# Patient Record
Sex: Female | Born: 1965 | Race: White | Hispanic: No | Smoking: Former smoker
Health system: Southern US, Community
[De-identification: ages and names within clinical notes are randomized; demographics above are authoritative.]

## PROBLEM LIST (undated history)

## (undated) DIAGNOSIS — N200 Calculus of kidney: Secondary | ICD-10-CM

## (undated) DIAGNOSIS — F329 Major depressive disorder, single episode, unspecified: Secondary | ICD-10-CM

## (undated) DIAGNOSIS — T4145XA Adverse effect of unspecified anesthetic, initial encounter: Secondary | ICD-10-CM

## (undated) DIAGNOSIS — S42009A Fracture of unspecified part of unspecified clavicle, initial encounter for closed fracture: Secondary | ICD-10-CM

## (undated) DIAGNOSIS — Z9889 Other specified postprocedural states: Secondary | ICD-10-CM

## (undated) DIAGNOSIS — K219 Gastro-esophageal reflux disease without esophagitis: Secondary | ICD-10-CM

## (undated) DIAGNOSIS — F32A Depression, unspecified: Secondary | ICD-10-CM

## (undated) DIAGNOSIS — F419 Anxiety disorder, unspecified: Secondary | ICD-10-CM

## (undated) DIAGNOSIS — T8859XA Other complications of anesthesia, initial encounter: Secondary | ICD-10-CM

## (undated) DIAGNOSIS — R112 Nausea with vomiting, unspecified: Secondary | ICD-10-CM

## (undated) DIAGNOSIS — Z87442 Personal history of urinary calculi: Secondary | ICD-10-CM

## (undated) HISTORY — DX: Major depressive disorder, single episode, unspecified: F32.9

## (undated) HISTORY — PX: ABDOMINAL HYSTERECTOMY: SHX81

## (undated) HISTORY — PX: LEG SURGERY: SHX1003

## (undated) HISTORY — DX: Calculus of kidney: N20.0

## (undated) HISTORY — DX: Depression, unspecified: F32.A

---

## 1992-09-05 HISTORY — PX: BREAST CYST EXCISION: SHX579

## 1998-07-07 ENCOUNTER — Other Ambulatory Visit: Admission: RE | Admit: 1998-07-07 | Discharge: 1998-07-07 | Payer: Self-pay | Admitting: *Deleted

## 1999-05-12 ENCOUNTER — Encounter: Admission: RE | Admit: 1999-05-12 | Discharge: 1999-08-10 | Payer: Self-pay | Admitting: Family Medicine

## 1999-05-31 ENCOUNTER — Other Ambulatory Visit: Admission: RE | Admit: 1999-05-31 | Discharge: 1999-05-31 | Payer: Self-pay | Admitting: *Deleted

## 2000-05-30 ENCOUNTER — Other Ambulatory Visit: Admission: RE | Admit: 2000-05-30 | Discharge: 2000-05-30 | Payer: Self-pay | Admitting: *Deleted

## 2000-11-08 ENCOUNTER — Encounter (INDEPENDENT_AMBULATORY_CARE_PROVIDER_SITE_OTHER): Payer: Self-pay | Admitting: Specialist

## 2000-11-08 ENCOUNTER — Ambulatory Visit (HOSPITAL_COMMUNITY): Admission: RE | Admit: 2000-11-08 | Discharge: 2000-11-08 | Payer: Self-pay | Admitting: Surgery

## 2001-04-30 ENCOUNTER — Other Ambulatory Visit: Admission: RE | Admit: 2001-04-30 | Discharge: 2001-04-30 | Payer: Self-pay | Admitting: *Deleted

## 2002-07-22 ENCOUNTER — Other Ambulatory Visit: Admission: RE | Admit: 2002-07-22 | Discharge: 2002-07-22 | Payer: Self-pay | Admitting: *Deleted

## 2004-11-25 ENCOUNTER — Other Ambulatory Visit: Admission: RE | Admit: 2004-11-25 | Discharge: 2004-11-25 | Payer: Self-pay | Admitting: Obstetrics and Gynecology

## 2004-12-31 ENCOUNTER — Emergency Department (HOSPITAL_COMMUNITY): Admission: EM | Admit: 2004-12-31 | Discharge: 2004-12-31 | Payer: Self-pay | Admitting: Emergency Medicine

## 2005-09-05 HISTORY — PX: CLAVICLE SURGERY: SHX598

## 2005-11-12 ENCOUNTER — Emergency Department (HOSPITAL_COMMUNITY): Admission: EM | Admit: 2005-11-12 | Discharge: 2005-11-12 | Payer: Self-pay | Admitting: Emergency Medicine

## 2005-11-30 ENCOUNTER — Other Ambulatory Visit: Admission: RE | Admit: 2005-11-30 | Discharge: 2005-11-30 | Payer: Self-pay | Admitting: Obstetrics and Gynecology

## 2006-04-17 ENCOUNTER — Inpatient Hospital Stay (HOSPITAL_COMMUNITY): Admission: AD | Admit: 2006-04-17 | Discharge: 2006-04-17 | Payer: Self-pay | Admitting: Obstetrics and Gynecology

## 2006-04-21 ENCOUNTER — Ambulatory Visit (HOSPITAL_COMMUNITY): Admission: RE | Admit: 2006-04-21 | Discharge: 2006-04-22 | Payer: Self-pay | Admitting: Orthopaedic Surgery

## 2006-05-04 ENCOUNTER — Encounter (INDEPENDENT_AMBULATORY_CARE_PROVIDER_SITE_OTHER): Payer: Self-pay | Admitting: Specialist

## 2006-05-04 ENCOUNTER — Ambulatory Visit (HOSPITAL_COMMUNITY): Admission: RE | Admit: 2006-05-04 | Discharge: 2006-05-05 | Payer: Self-pay | Admitting: Obstetrics and Gynecology

## 2006-05-08 ENCOUNTER — Emergency Department (HOSPITAL_COMMUNITY): Admission: EM | Admit: 2006-05-08 | Discharge: 2006-05-08 | Payer: Self-pay | Admitting: *Deleted

## 2006-05-10 ENCOUNTER — Inpatient Hospital Stay (HOSPITAL_COMMUNITY): Admission: AD | Admit: 2006-05-10 | Discharge: 2006-05-12 | Payer: Self-pay | Admitting: Obstetrics and Gynecology

## 2007-04-27 ENCOUNTER — Ambulatory Visit (HOSPITAL_COMMUNITY): Admission: RE | Admit: 2007-04-27 | Discharge: 2007-04-27 | Payer: Self-pay | Admitting: Orthopaedic Surgery

## 2008-03-22 IMAGING — US US TRANSVAGINAL NON-OB
1 series · 14 of 25 positions shown · non-contrast
Comparison: none

CLINICAL DATA: Abnormal bleeding and abdominal pain.
 TRANSABDOMINAL AND TRANSVAGINAL PELVIC ULTRASOUND ? 04/17/06:
TECHNIQUE: Both transabdominal and transvaginal ultrasound examinations of the pelvis were performed including evaluation of the uterus, ovaries, adnexal regions, and pelvic cul-de-sac.

[Series 1: us transvaginal non-ob · 0.33mm/px · 14 of 35 slices shown]
[im 1/35]
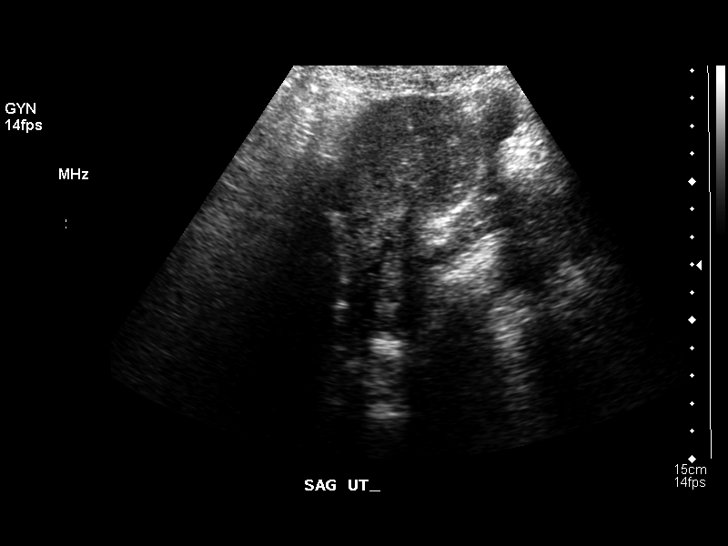
[im 3/35]
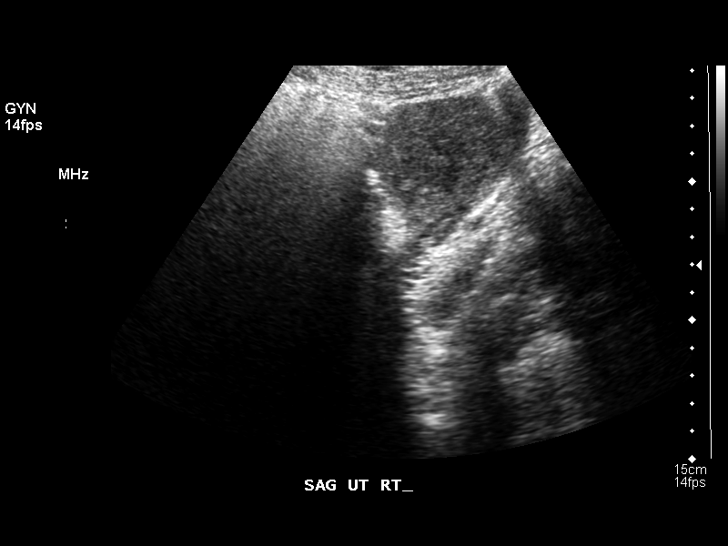
[im 6/35]
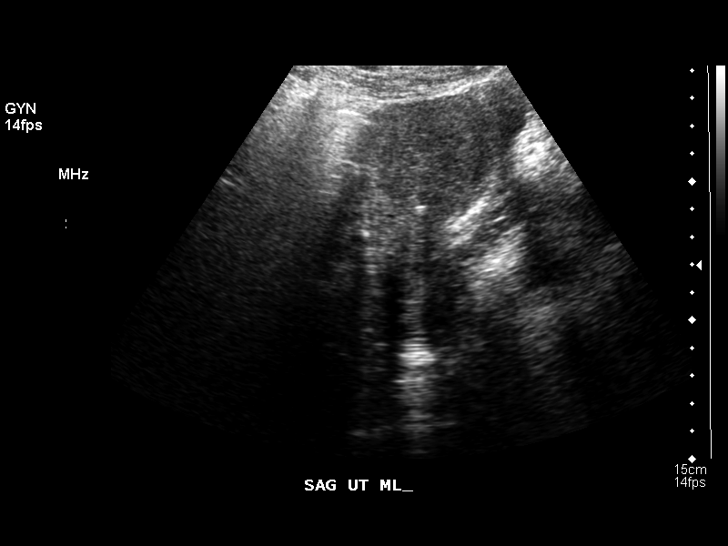
[im 9/35]
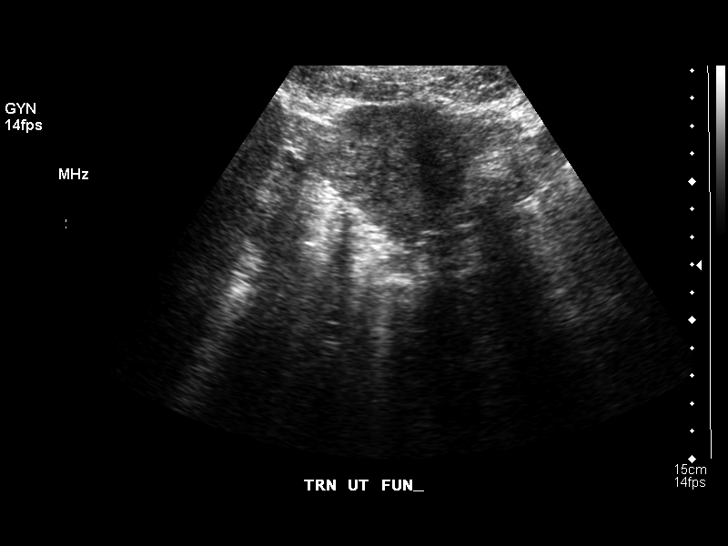
[im 12/35]
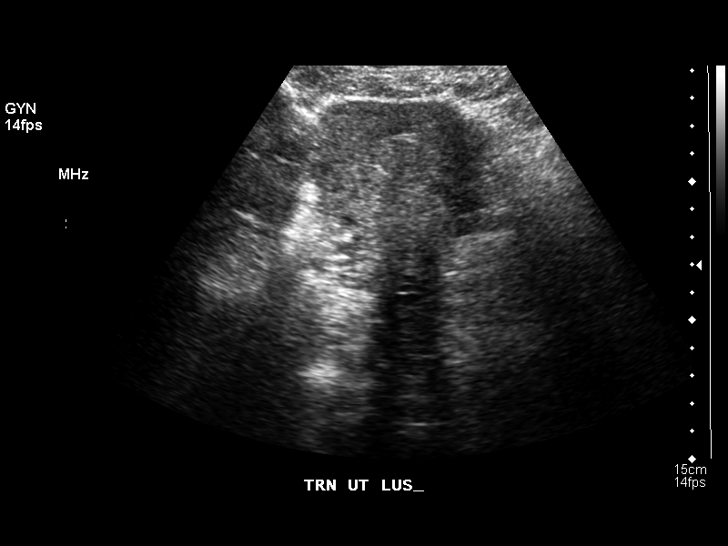
[im 13/35]
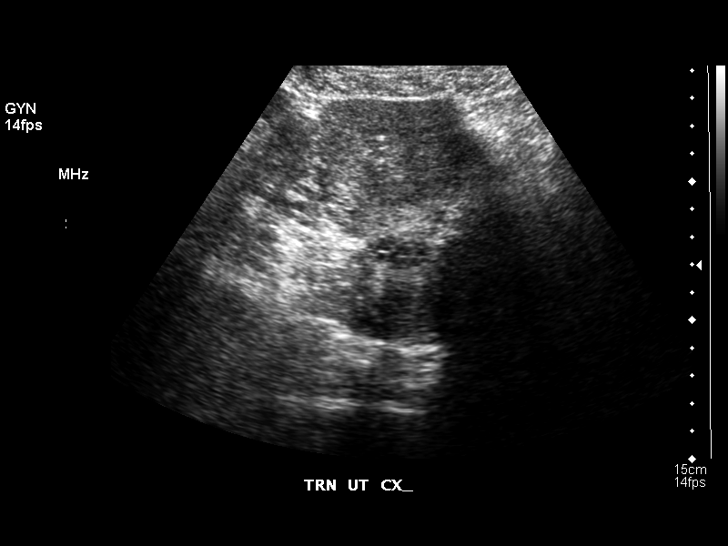
[im 16/35]
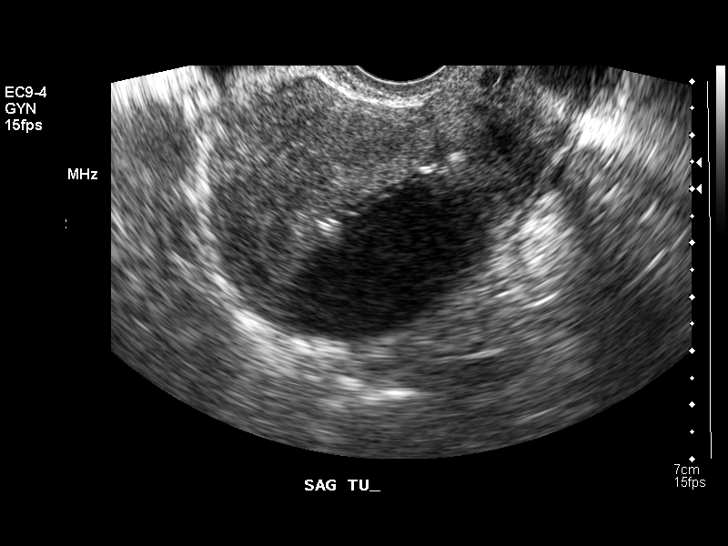
[im 19/35]
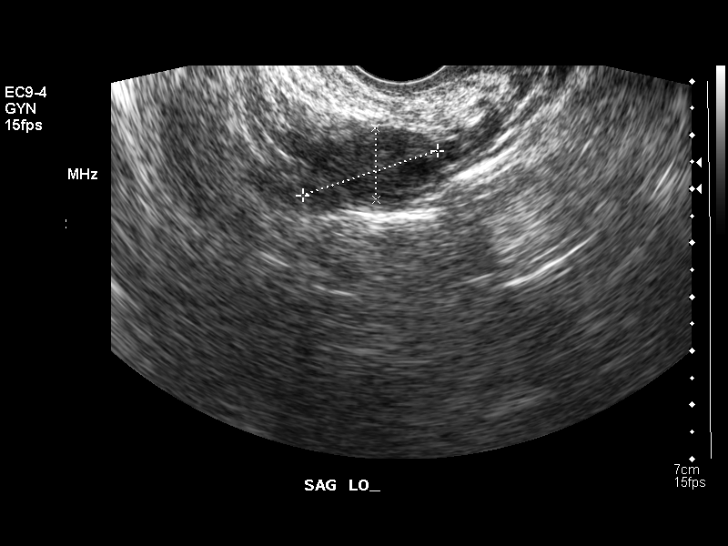
[im 22/35]
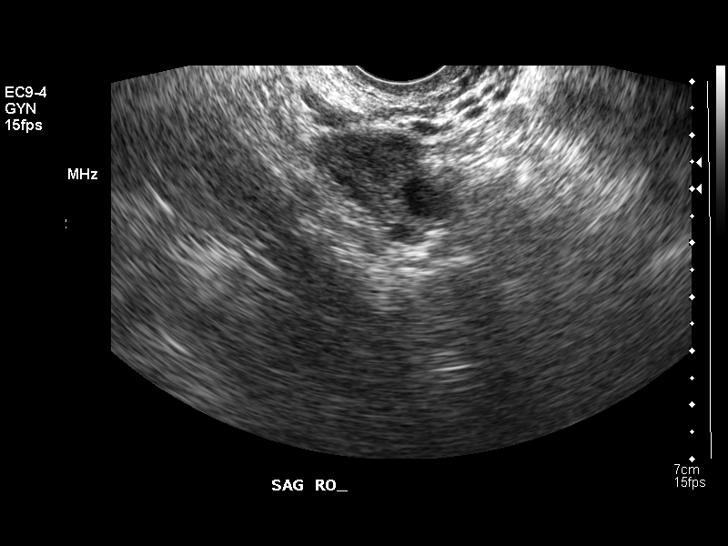
[im 23/35]
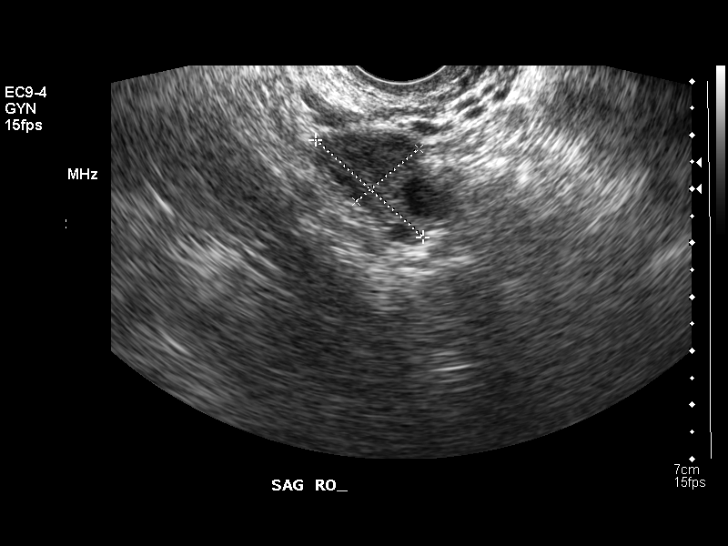
[im 26/35]
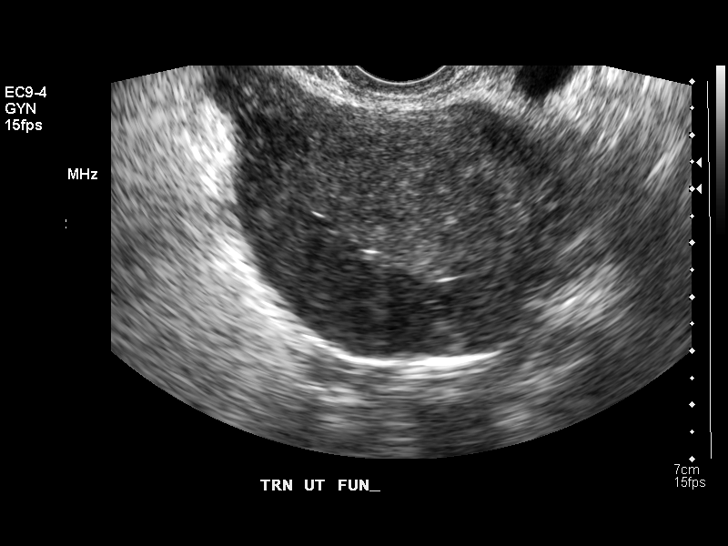
[im 29/35]
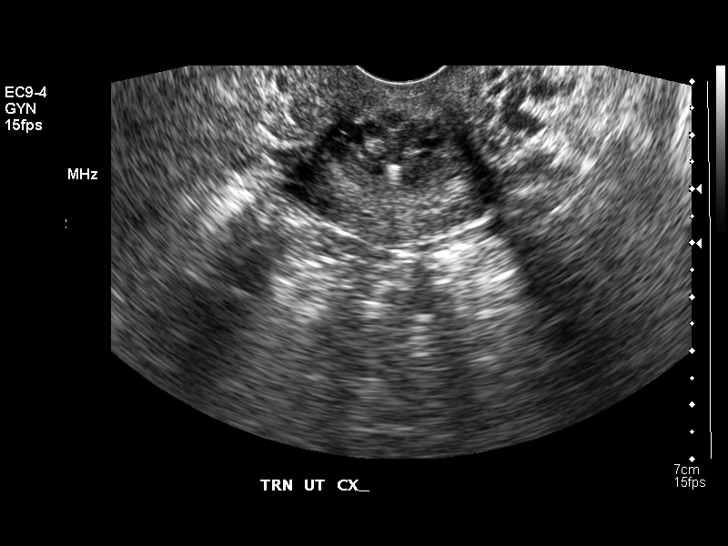
[im 32/35]
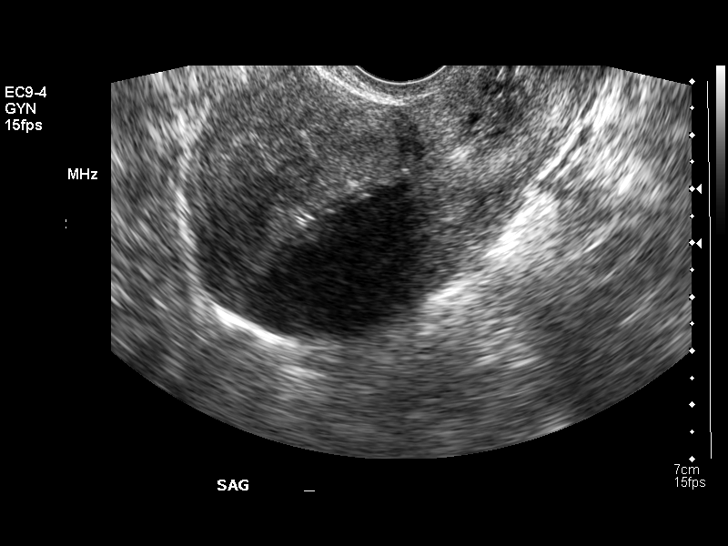
[im 35/35]
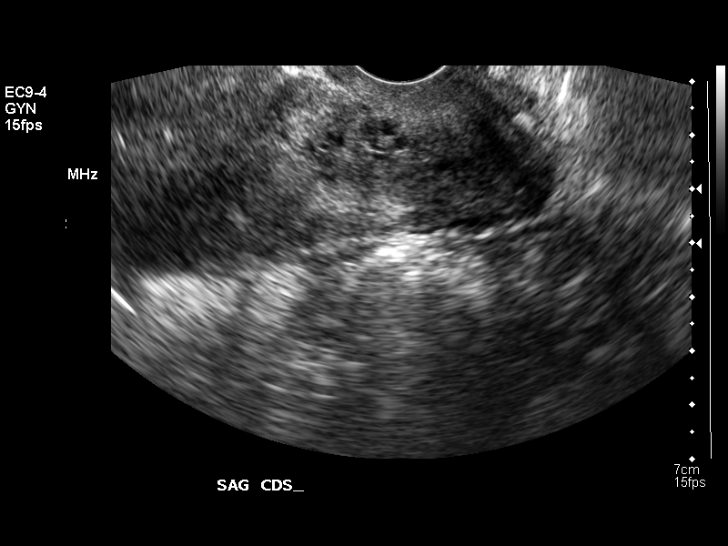

[14 of 25 positions shown; findings below may reference images not displayed]

FINDINGS: An IUD is in place creating an acoustical shadow from the endometrial complex.  The uterus measures 8.8 cm in length and the fundus measures 4.9 x 5.9 cm in transverse dimensions.  The right ovary measures 1.5 x 1.7 x 2.7 cm.  The left ovary measures 1.3 x 2.3 x 2.6 cm.  No free pelvic fluid.  No adnexal mass.
IMPRESSION: IUD.  No definite primary uterine or ovarian abnormality.  See comments above.

## 2009-08-23 ENCOUNTER — Ambulatory Visit: Payer: Self-pay | Admitting: Radiology

## 2009-08-23 ENCOUNTER — Encounter (HOSPITAL_COMMUNITY): Payer: Self-pay | Admitting: Emergency Medicine

## 2009-08-24 ENCOUNTER — Inpatient Hospital Stay (HOSPITAL_COMMUNITY): Admission: EM | Admit: 2009-08-24 | Discharge: 2009-08-26 | Payer: Self-pay

## 2010-12-06 LAB — COMPREHENSIVE METABOLIC PANEL
AST: 36 U/L (ref 0–37)
Albumin: 2.7 g/dL — ABNORMAL LOW (ref 3.5–5.2)
Albumin: 2.9 g/dL — ABNORMAL LOW (ref 3.5–5.2)
Alkaline Phosphatase: 58 U/L (ref 39–117)
BUN: 1 mg/dL — ABNORMAL LOW (ref 6–23)
Calcium: 8 mg/dL — ABNORMAL LOW (ref 8.4–10.5)
Chloride: 109 mEq/L (ref 96–112)
Chloride: 110 mEq/L (ref 96–112)
Creatinine, Ser: 0.7 mg/dL (ref 0.4–1.2)
GFR calc Af Amer: >60 mL/min (ref >60)
Glucose, Bld: 97 mg/dL (ref 70–99)
Potassium: 3.8 mEq/L (ref 3.5–5.1)
Sodium: 139 mEq/L (ref 135–145)
Total Bilirubin: 0.4 mg/dL (ref 0.3–1.2)

## 2010-12-06 LAB — CBC
HCT: 32.7 % — ABNORMAL LOW (ref 36.0–46.0)
HCT: 37.4 % (ref 36.0–46.0)
Hemoglobin: 10.8 g/dL — ABNORMAL LOW (ref 12.0–15.0)
Hemoglobin: 11.6 g/dL — ABNORMAL LOW (ref 12.0–15.0)
MCHC: 34.2 g/dL (ref 30.0–36.0)
MCHC: 35.7 g/dL (ref 30.0–36.0)
MCV: 95.9 fL (ref 78.0–100.0)
MCV: 94 fL (ref 78.0–100.0)
Platelets: 96 10*3/uL — ABNORMAL LOW (ref 150–400)
Platelets: 97 10*3/uL — ABNORMAL LOW (ref 150–400)
Platelets: 121 10*3/uL — ABNORMAL LOW (ref 150–400)
RBC: 3.45 MIL/uL — ABNORMAL LOW (ref 3.87–5.11)
RDW: 11.6 % (ref 11.5–15.5)
WBC: 2.1 10*3/uL — ABNORMAL LOW (ref 4.0–10.5)
WBC: 2.3 10*3/uL — ABNORMAL LOW (ref 4.0–10.5)
WBC: 2.4 10*3/uL — ABNORMAL LOW (ref 4.0–10.5)

## 2010-12-06 LAB — DIFFERENTIAL
Basophils Absolute: 0 10*3/uL (ref 0.0–0.1)
Basophils Absolute: 0 10*3/uL (ref 0.0–0.1)
Basophils Relative: 1 % (ref 0–1)
Eosinophils Absolute: 0 10*3/uL (ref 0.0–0.7)
Eosinophils Relative: 0 % (ref 0–5)
Eosinophils Relative: 1 % (ref 0–5)
Lymphocytes Relative: 17 % (ref 12–46)
Lymphocytes Relative: 34 % (ref 12–46)
Lymphocytes Relative: 8 % — ABNORMAL LOW (ref 12–46)
Lymphs Abs: 0.4 10*3/uL — ABNORMAL LOW (ref 0.7–4.0)
Lymphs Abs: 0.8 10*3/uL (ref 0.7–4.0)
Monocytes Absolute: 0.1 10*3/uL (ref 0.1–1.0)
Monocytes Absolute: 0.1 10*3/uL (ref 0.1–1.0)
Monocytes Relative: 5 % (ref 3–12)
Neutro Abs: 1.7 10*3/uL (ref 1.7–7.7)
Neutrophils Relative %: 77 % (ref 43–77)

## 2010-12-06 LAB — URINALYSIS, ROUTINE W REFLEX MICROSCOPIC
Bilirubin Urine: NEGATIVE
Glucose, UA: NEGATIVE mg/dL
Hgb urine dipstick: NEGATIVE

## 2010-12-06 LAB — CULTURE, BLOOD (ROUTINE X 2): Culture: NO GROWTH

## 2010-12-06 LAB — BASIC METABOLIC PANEL
BUN: 7 mg/dL (ref 6–23)
BUN: 12 mg/dL (ref 6–23)
Creatinine, Ser: 0.59 mg/dL (ref 0.4–1.2)
GFR calc Af Amer: >60 mL/min (ref >60)
GFR calc non Af Amer: >60 mL/min (ref >60)
GFR calc non Af Amer: >60 mL/min (ref >60)
Glucose, Bld: 112 mg/dL — ABNORMAL HIGH (ref 70–99)
Glucose, Bld: 94 mg/dL (ref 70–99)
Potassium: 3.6 mEq/L (ref 3.5–5.1)

## 2010-12-06 LAB — URINE CULTURE: Culture: NO GROWTH

## 2010-12-06 LAB — LACTIC ACID, PLASMA: Lactic Acid, Venous: 0.6 mmol/L (ref 0.5–2.2)

## 2010-12-06 LAB — PARVOVIRUS B19 ANTIBODY, IGG AND IGM: Parovirus B19 IgG Abs: 6.5 Index — ABNORMAL HIGH (ref <0.9)

## 2010-12-06 LAB — PROTIME-INR: Prothrombin Time: 12.8 seconds (ref 11.6–15.2)
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
## 2011-01-21 NOTE — H&P (Signed)
NAMEFLORIE, CARICO                ACCOUNT NO.:  [id number]   MEDICAL RECORD NO.:  [id number]          PATIENT TYPE:  INP   LOCATION:  9311                          FACILITY:  [facility]   PHYSICIAN:  Juluis Mire, M.D.   DATE OF BIRTH:  01-05-1966   DATE OF ADMISSION:  05/10/2006  DATE OF DISCHARGE:                                HISTORY & PHYSICAL   The patient is a 45 year old gravida 2, para 2 female who is readmitted  after laparoscopic-assisted vaginal hysterectomy on May 04, 2006.  She  had a normal postoperative course.  She has reported some nausea and  vomiting with episodes of passing out.  She evidently went to the emergency  room at [facility] on Monday, was given IV fluids and subsequently sent  home.  She has been having some lower abdominal pain, no active bleeding,  does report a fever at home although she is not exactly sure what.  She has  had continued normal bowel function, no urinary difficulties.  In the office  it was noted that her temperature was 100.9.  Exam was probably consistent  with a cuff hematoma with associated cellulitis.  She will be admitted for  IV antibiotics.   ALLERGIES:  She is allergic to INAPSINE.   MEDICATIONS:  Include Lexapro and Percocet for pain.   PAST MEDICAL HISTORY:  Usual childhood diseases, no significant sequelae.  Did have a history of gestational diabetes with her pregnancy.   PAST SURGICAL HISTORY:  She had a benign tumor removed from the right breast  in 1993.  She had a previous bilateral tubal ligation.  As noted above, she  had a laparoscopic-assisted vaginal hysterectomy.   OBSTETRICAL HISTORY:  She has had two cesarean sections.   FAMILY HISTORY:  Positive for diabetes.   SOCIAL HISTORY:  Does reveal tobacco use with occasional alcohol use.   REVIEW OF SYSTEMS:  Noncontributory.   PHYSICAL EXAMINATION:  VITAL SIGNS:  The patient's temperature is 100.9.  Other vital signs are stable.  LUNGS:  Clear.  CARDIOVASCULAR:  Regular rhythm and rate without murmurs or gallops.  ABDOMEN:  Soft.  Bowel sounds are active.  Minimal tenderness noted.  No  peritoneal signs.  Subumbilical and suprapubic incisions intact.  PELVIC:  She does have a cuff fullness with associated tenderness.  EXTREMITIES:  Trace edema.  NEUROLOGIC:  Grossly within normal limits.   IMPRESSION:  1. Status post laparoscopic-assisted vaginal hysterectomy.  2. Cuff hematoma with possible cellulitis.   PLAN:  The patient will be brought in for IV hydration.  Will begin IV  Unasyn.  We will see what clinical response we get.  If continues to have  issue may consider pelvic ultrasound or abdominal x-ray series.      Juluis Mire, M.D.  Electronically Signed     JSM/MEDQ  D:  05/10/2006  T:  05/10/2006  Job:  [id number]
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
## 2011-01-21 NOTE — Op Note (Signed)
[facility]  Patient:    Virginia [name], Virginia [name]                       MRN: [id number] Proc. Date: 11/08/00 Adm. Date:  [id number] Attending:  Charlton Haws                           Operative Report  CCS#:  [phone] [id number]  PREOPERATIVE DIAGNOSIS:  Lipoma left posterior neck.  POSTOPERATIVE DIAGNOSIS:  Lipoma left posterior neck.  OPERATION PERFORMED:  Removal lipoma left posterior neck.  SURGEON:  Dr. Jamey Ripa.  ANESTHESIA:  General.  CLINICAL HISTORY:  This patient is a 45 year old with a recently enlarging mass left posterior neck which clinically felt like a lipoma but had become uncomfortable to her and had also noted to be enlarging over the last several months.  DESCRIPTION OF PROCEDURE:  The patient was brought to the operating room having had the area in question identified in the holding area. After satisfactory general anesthesia, she was turned to the right side down to get better exposure of the neck and the area of was prepped and draped. To help with ______ management, I injected approximately 15 cc of 0.25% Marcaine with epinephrine. I made a short incision over the mass and tried to keep this in the skin line. Dividing ______ of the subcutaneous tissue, the mass appeared to be a lipoma. Using a combination of cautery and blunt dissection, it was able to be removed in toto and intact. It did seem to be stuck a little bit along the medial aspect. However, I really stayed very close to the mass itself to avoid injury to any nerve structures or other tissue here.  Once this was removed, I closed the incision with some 3-0 Vicryl to try to close the subcu as well as eliminate the dead space with some 4-0 monocryl subcuticular and Durabond on the skin. The patient tolerated the procedure well. There were no operative complications. All counts were correct. DD:  11/08/00 TD:  11/08/00 Job: [id number]

## 2011-01-21 NOTE — H&P (Signed)
[name], [name]                ACCOUNT NO.:  [id number]   MEDICAL RECORD NO.:  [id number]          PATIENT TYPE:  AMB   LOCATION:  SDC                           FACILITY:  [facility]   PHYSICIAN:  Juluis Mire, M.D.   DATE OF BIRTH:  [birth year]   DATE OF ADMISSION:  05/04/2006  DATE OF DISCHARGE:                                HISTORY & PHYSICAL   HISTORY OF PRESENT ILLNESS:  The patient is a 45 year old gravida 2, para 2  female who presents for laparoscopic-assisted vaginal hysterectomy.   In relation to the present admission, the patient is followed for abnormal  bleeding.  Her cycles are usually 20-21 days apart.  She has five days of  flow using four pads per day with clots.  She has no significant  dysmenorrhea at initial evaluation.  She was having the polymenorrhea.  We  did a saline infusion ultrasound that was basically unremarkable.  Our  treatment option was a Marine IUD.  It is of note that she has had a  previous laparoscopic bilateral tubal ligation.  Despite the green Marine  IUD, she continued to have abnormal bleeding and had increasing pain and  discomfort.  We tried treated with antibiotics, and this was unsuccessful.  We subsequently have removed the IUD.  We have done follow up ultrasounds  that have basically been unremarkable.  It is presumptive that by the saline  infused ultrasound that she does have uterine adenomyosis.  She does have  tobacco use that is limited in terms of using birth control pills.  Because  of menstrual irregularities, it was felt that ablation was contraindicated.  Therefore, the patient now presents for laparoscopic-assisted vaginal  hysterectomy for management of continued abnormal bleeding as well as pelvic  pain.   ALLERGIES:  ANAPSINE.   MEDICATIONS:  1. Lexapro.  2. Percocet.   PAST MEDICAL HISTORY:  1. Usual childhood diseases without significant sequelae.  2. Did have gestational diabetes with her pregnancy, and there is  a family      history of diabetes.   PAST SURGICAL HISTORY:  1. She has had a benign tumor removed from the right breast in 1993.  2. She has had previous bilateral tubal ligation.   OBSTETRIC HISTORY:  She has had two cesarean sections.   FAMILY HISTORY:  History of diabetes.   SOCIAL HISTORY:  Does reveal tobacco use, occasional alcohol use.   REVIEW OF SYSTEMS:  Noncontributory.   PHYSICAL EXAMINATION:  VITAL SIGNS:  The patient is afebrile, stable vital  signs.  HEENT:  The patient is normocephalic.  Pupils equal, round and reactive to  light and accommodations.  Extraocular muscles intact.  Sclerae and  conjunctivae were clear.  Oropharynx clear.  NECK:  Without thyromegaly.  BREASTS:  No discrete masses.  LUNGS:  Clear.  CARDIOVASCULAR:  Regular rate and rhythm without murmurs or gallops.  ABDOMEN:  Benign.  No masses, organomegaly or tenderness.  PELVIC:  Normal external genitalia.  Vaginal mucosa is clear.  Cervix is  unremarkable.  Uterus upper limits of normal size consistent with  adenomyosis.  Adnexa free of masses or tenderness.  EXTREMITIES:  Trace edema.  NEUROLOGICAL:  Grossly within normal limits.   IMPRESSION:  Abnormal uterine bleeding and pelvic pain secondary to uterine  adenomyosis.   PLAN:  After discussions of options, will proceed with laparoscopic-assisted  vaginal hysterectomy.  The risks have been discussed including the risks of  infection, risk of hemorrhage could require transfusion, risks of AIDS or  hepatitis, risks of injury to adjacent organs including bladder, bowel or  ureter that could require further exploratory surgery, risks of deep vein  thrombosis and pulmonary embolus.  The patient expressed understanding of  indications and risks.      Juluis Mire, M.D.  Electronically Signed     JSM/MEDQ  D:  05/04/2006  T:  05/04/2006  Job:  [id number]

## 2011-01-21 NOTE — Discharge Summary (Signed)
Virginia [name], Virginia [name]                ACCOUNT NO.:  [id number]   MEDICAL RECORD NO.:  [id number]          PATIENT TYPE:  OIB   LOCATION:  9305                          FACILITY:  [facility]   PHYSICIAN:  Juluis Mire, M.D.   DATE OF BIRTH:  1966-08-05   DATE OF ADMISSION:  05/04/2006  DATE OF DISCHARGE:  05/05/2006                                 DISCHARGE SUMMARY   ADMITTING DIAGNOSES:  Pelvic pain, abnormal bleed thought to be secondary to  pelvic processes such as adenomyosis.   POSTOPERATIVE DIAGNOSES:  Pelvic pain, abnormal bleed thought to be  secondary to pelvic processes such as adenomyosis, pelvic adhesions.   PROCEDURE:  Open laparoscopy, lysis of adhesions and laparoscopic-assisted  vaginal hysterectomy.  For a complete history and physical, please see  dictated noted.   COURSE IN THE HOSPITAL:  Patient underwent the above noted surgery, did  extremely well.  Post-op hemoglobin was 11.  She was discharged home on her  first post-op day, at that time afebrile, stable vital signs.  Abdomen soft  and nontender.  Bowel sounds were active.  She was voiding without  difficulty.  No active bleeding.   In terms of complications, none.  __________ to stay in the hospital.  Patient discharged home in stable condition.   DISPOSITION:  Routine post-op instructions already given.  She is to avoid  heavy lifting, vaginal entry, driving a car.  Discharged home on Tylenol as  needed for pain.  She is to watch for signs of infection, nausea, vomiting,  increasing abdominal  pain, active vaginal bleeding or signs of deep venous  thrombosis.  Follow up in the office in one week.      Juluis Mire, M.D.  Electronically Signed     JSM/MEDQ  D:  05/05/2006  T:  05/05/2006  Job:  [id number]

## 2011-01-21 NOTE — Discharge Summary (Signed)
Virginia [name], Virginia [name]                ACCOUNT NO.:  [id number]   MEDICAL RECORD NO.:  [id number]          PATIENT TYPE:  INP   LOCATION:  9311                          FACILITY:  [facility]   PHYSICIAN:  Juluis Mire, M.D.   DATE OF BIRTH:  [birth year]   DATE OF ADMISSION:  05/10/2006  DATE OF DISCHARGE:  05/12/2006                                 DISCHARGE SUMMARY   ADMITTING DIAGNOSIS:  Postoperative laparoscopically assisted vaginal  hysterectomy with possible cuff hematoma and cellulitis.   DISCHARGE DIAGNOSIS:  Cuff cellulitis.   PROCEDURE:  Intravenous hydration and antibiotics.   HISTORY OF PRESENT ILLNESS:  For a complete history and physical, please see  dictated note.   COURSE IN THE HOSPITAL:  She was brought in and begun on IV hydration and  begun on IV Unasyn.  Her CBC was obtained.  Her white count at that time was  7900, hemoglobin 10.1, which was basically stable from her postop check.  We  did do an ultrasound; there is no evidence of a vaginal cuff hematoma or  abscess.  She rapidly became afebrile and remained so throughout the  remaining hospital stay and in fact, her only elevation was actually on  admission and that was 100.5.  She did feel better with the hydration and  the antibiotics.  On the date of discharge, she was afebrile with stable  vital signs, abdomen soft and nontender.  Bowel sounds were active.  She was  having normal bowel movement, but still had some minimal nausea, no active  bleeding.  She will be discharged home at this time.   In terms of complication, none were encountered during stay in the hospital.  The patient is discharged home in stable condition.   DISPOSITION:  Continue postop management, avoid heavy lifting and vaginal  entrance.  We will watch her temperature at home; she will call with any  increase in elevation, nausea or vomiting, or any active vaginal bleeding.   DISCHARGE MEDICATIONS:  Discharged home on Darvocet as needed for  pain.  We  are going to give her some Phenergan for nausea and complete a course of  Augmentin.   FOLLOWUP:  She will follow up in the office early next week.      Juluis Mire, M.D.  Electronically Signed     JSM/MEDQ  D:  05/12/2006  T:  05/12/2006  Job:  [id number]

## 2011-01-21 NOTE — Op Note (Signed)
NAMEHENNESSEY, Virginia [name]                ACCOUNT NO.:  [id number]   MEDICAL RECORD NO.:  [id number]          PATIENT TYPE:  OIB   LOCATION:  9305                          FACILITY:  [facility]   PHYSICIAN:  Juluis Mire, M.D.   DATE OF BIRTH:  Aug 13, 1966   DATE OF PROCEDURE:  05/04/2006  DATE OF DISCHARGE:                                 OPERATIVE REPORT   PREOPERATIVE DIAGNOSIS:  Pelvic pain, probable uterine adenomyosis.   POSTOPERATIVE DIAGNOSES:  1. Pelvic pain, probable uterine adenomyosis.  2. Pelvic adhesions.   PROCEDURE:  Laparoscopy with lysis of adhesions and subsequent laparoscopic  assisted vaginal hysterectomy.   SURGEON:  Dr. Richardean Chimera.   ANESTHESIA:  General endotracheal.   ESTIMATED BLOOD LOSS:  400 to 500 cc.   PACKS AND DRAINS:  None.   COMMENTS:  Intraoperative __________  placed.   COMPLICATIONS:  None.   INDICATIONS:  Are as noted in the history and physical.   PROCEDURE:  The patient was taken to the OR and placed in the supine  position.  After a satisfactory level of general endotracheal anesthesia was  obtained, the patient was placed in the dorsal lithotomy position using the  Allen stirrups.  At this point, the abdomen, perineum, and vagina were  prepped with Betadine.  The bladder was emptied by catheterization.  A  weighted speculum was placed in the vaginal vault. Cervix was __________  with a single tooth tenaculum and a Hulka tenaculum was put in place.  A  single tooth tenaculum was then removed.   The patient was then draped in a sterile field.  A subumbilical incision was  made with a knife.  The Veress needle was then introduced into the abdominal  cavity.  The abdomen was insufflated to approximately three liters of carbon  dioxide.  The operative laparoscope was then introduced.  There was no  evidence of injury to adjacent organs.  A 5 mm trocar was put in place in  the suprapubic area under direct visualization.  The adnexa was normal.   The  upper abdomen including the liver tip and the gallbladder were clear.  She  had adhesions from the left adnexa to the anterior abdominal wall.  Her  uterus was otherwise normal.  The right ovary was clear.  Next using the  __________, we first freed up the adhesions from the left adnexa to the  anterior abdominal wall and then the left uretero ovarian pedicle was  cauterized and excised.  The left tube and mesosalpinx was cauterized and  excised.  The left round ligament was cauterized and excised.   We then went to the right side where the right utero-ovarian pedicle was  cauterized and excised.  The right tube and mesosalpinx were cauterized and  excised and then the right round ligament was cauterized and excised.  We  had good freeing up of the uterus bilaterally.   The laparoscope was introduced and the abdomen was deflated with carbon  dioxide.  The Hulka tenaculum was then removed.  A weighted speculum was  placed in the vaginal vault.  The cervix was grasped with the [facility]  tenaculum.  The cul-de-sac was entered sharply.  Both uterosacral ligaments  were clamped, cut, and suture ligated with 0 Vicryl.  Flexing the vaginal  mucosa anteriorly was incised and the bladder was dissected superiorly.  Using the Gyrus Cautery System, we began cauterizing and incising the  parametria tissue.  This was continued up to size the uterus.  We eventually  identified the vesicouterine space and put the retractor in place.  The  uterus was then flipped and the remaining pedicles were clamped and  cauterized.  The uterus and cervix were passed off the operative field.  We  did have some brisk bleeding.  We did suture several areas along the vaginal  cuff.  At this point in time, the vaginal cuff was closed in a vertical  fashion with some figure-of-eight 0 Vicryl and a Foley was placed to  straight drainage to retrieve a large amount of clear urine.  Sponge stick  was placed in the vaginal  vault.   The weighted speculum was then removed.  The patient's legs were  repositioned and the abdomen was reflux of carbon dioxide and the  laparoscope was introduced.  We irrigated the pelvis and did have a bleeding  point on the left pelvic side wall.  This was cauterized with the bipolar  Gyrus.  At this point, we had excellent hemostasis.  Some oozing was noted  from the vaginal cuff and controlled with bipolar.  Both ovarian pedicles  were hemostatically intact.  The left ovary was free at this point in time.  We thoroughly irrigated the pelvis.  We had good hemostasis.  The abdomen  was deflated with carbon dioxide and all trocars were removed.  The  subumbilical incision was closed with interrupted subcuticular with 4-0  Vicryl. The suprapubic incision was closed with Dermabond.  A Foley catheter  continued to drain clear urine.  The sponge on the sponge stick was removed.  The patient was taken out of the dorsal lithotomy position.  The patient  once extubated was transferred to the recovery room in good condition.  Sponge, instrument, and needle counts was correct by the circulating nurse  x2.      Juluis Mire, M.D.  Electronically Signed     JSM/MEDQ  D:  05/04/2006  T:  05/05/2006  Job:  [id number]

## 2011-06-17 LAB — CBC
Hemoglobin: 13.3
RBC: 4.13
WBC: 5.5

## 2011-09-07 ENCOUNTER — Ambulatory Visit: Payer: Self-pay

## 2012-03-04 ENCOUNTER — Encounter (HOSPITAL_BASED_OUTPATIENT_CLINIC_OR_DEPARTMENT_OTHER): Payer: Self-pay | Admitting: Emergency Medicine

## 2012-03-04 ENCOUNTER — Emergency Department (HOSPITAL_BASED_OUTPATIENT_CLINIC_OR_DEPARTMENT_OTHER)
Admission: EM | Admit: 2012-03-04 | Discharge: 2012-03-04 | Disposition: A | Discharge status: Home / Self Care | Payer: Self-pay | Attending: Emergency Medicine | Admitting: Emergency Medicine

## 2012-03-04 DIAGNOSIS — T7840XA Allergy, unspecified, initial encounter: Secondary | ICD-10-CM

## 2012-03-04 DIAGNOSIS — L2989 Other pruritus: Secondary | ICD-10-CM | POA: Insufficient documentation

## 2012-03-04 DIAGNOSIS — R21 Rash and other nonspecific skin eruption: Secondary | ICD-10-CM | POA: Insufficient documentation

## 2012-03-04 DIAGNOSIS — L298 Other pruritus: Secondary | ICD-10-CM | POA: Insufficient documentation

## 2012-03-04 HISTORY — DX: Fracture of unspecified part of unspecified clavicle, initial encounter for closed fracture: S42.009A

## 2012-03-04 MED ORDER — METHYLPREDNISOLONE SODIUM SUCC 125 MG IJ SOLR
125.0000 mg | Freq: Once | INTRAMUSCULAR | Status: AC
  Administered 2012-03-04: 125 mg via INTRAVENOUS
  Filled 2012-03-04: qty 2

## 2012-03-04 MED ORDER — FAMOTIDINE IN NACL 20-0.9 MG/50ML-% IV SOLN
20.0000 mg | Freq: Once | INTRAVENOUS | Status: AC
  Administered 2012-03-04: 20 mg via INTRAVENOUS
  Filled 2012-03-04: qty 50

## 2012-03-04 MED ORDER — PREDNISONE 20 MG PO TABS: 40.0000 mg | ORAL_TABLET | Freq: Every day | ORAL | Status: DC

## 2012-03-04 MED ORDER — DIPHENHYDRAMINE HCL 50 MG/ML IJ SOLN
25.0000 mg | Freq: Once | INTRAMUSCULAR | Status: AC
  Administered 2012-03-04: 25 mg via INTRAVENOUS
  Filled 2012-03-04: qty 1

## 2012-03-04 NOTE — Discharge Instructions (Signed)

## 2012-03-04 NOTE — ED Provider Notes (Signed)
History     CSN: [id number]  Arrival date & time 03/04/12  1158   First MD Initiated Contact with Patient 03/04/12 1241      Chief Complaint  Patient presents with  . Allergic Reaction    (Consider location/radiation/quality/duration/timing/severity/associated sxs/prior treatment) HPI Comments: Pt states unknown exposure no new food,lotions etc:pt states that she has been taking benadryl but it isn't helping:pt states that she has been having redness and itching to face and the left eye is very itchy:pt denies respiratory problems  Patient is a 46 y.o. female presenting with allergic reaction. The history is provided by the patient. No language interpreter was used.  Allergic Reaction The primary symptoms are  rash. The primary symptoms do not include shortness of breath, nausea, vomiting or angioedema. The current episode started 2 days ago. The problem has not changed since onset. The rash is associated with itching.  Significant symptoms also include flushing and itching. Significant symptoms that are not present include eye redness.    Past Medical History  Diagnosis Date  . Collar bone fracture     Past Surgical History  Procedure Date  . Leg surgery   . Cesarean section   . Abdominal hysterectomy     No family history on file.  History  Substance Use Topics  . Smoking status: Not on file  . Smokeless tobacco: Not on file  . Alcohol Use:     OB History    Grav Para Term Preterm Abortions TAB SAB Ect Mult Living                  Review of Systems  Constitutional: Negative.   HENT: Negative.   Eyes: Positive for itching. Negative for redness.  Respiratory: Negative for shortness of breath.   Gastrointestinal: Negative for nausea and vomiting.  Skin: Positive for flushing, itching and rash.    Allergies  Review of patient's allergies indicates no known allergies.  Home Medications   Current Outpatient Rx  Name Route Sig Dispense Refill  .  SERTRALINE HCL 50 MG PO TABS Oral Take 50 mg by mouth daily.      BP 95/65  Pulse 74  Temp 98.4 F (36.9 C) (Oral)  Resp 16  SpO2 100%  Physical Exam  Nursing note and vitals reviewed. Constitutional: She is oriented to person, place, and time. She appears well-developed and well-nourished.  HENT:  Right Ear: External ear normal.  Left Ear: External ear normal.  Eyes: Conjunctivae and EOM are normal. Pupils are equal, round, and reactive to light.  Neck: Neck supple.  Cardiovascular: Normal rate and regular rhythm.   Pulmonary/Chest: Effort normal and breath sounds normal.  Musculoskeletal: Normal range of motion.  Neurological: She is alert and oriented to person, place, and time.  Skin:       Pt has bright red face and arms  Psychiatric: She has a normal mood and affect.    ED Course  Procedures (including critical care time)  Labs Reviewed - No data to display No results found.   1. Allergic reaction       MDM  Pt feeling better at this time:will keep on steroids for a couple of days:pt instructed to continue benadryl at home:unknown source of reaction       Teressa Lower, NP 03/04/12 1541

## 2012-03-04 NOTE — ED Notes (Signed)
Pt appears less red than when she arrived.  Pt states she still feels itching and feels swollen.

## 2012-03-04 NOTE — ED Notes (Signed)
Pt having possible allergic reaction since Friday to unknown substance.  Pt states she itches all over.  Pt relates left eye and left ear are worse.  Pt has sensation of diff breathing.  No closing of airway.  No resp distress noted.  Pt states she doesn't feel well.

## 2012-03-07 NOTE — ED Provider Notes (Signed)
Medical screening examination/treatment/procedure(s) were performed by non-physician practitioner and as supervising physician I was immediately available for consultation/collaboration.  Cyndra Numbers, MD 03/07/12 1323

## 2014-06-10 ENCOUNTER — Other Ambulatory Visit: Payer: Self-pay | Admitting: Obstetrics and Gynecology

## 2014-06-12 LAB — CYTOLOGY - PAP

## 2014-08-18 ENCOUNTER — Emergency Department: Payer: Self-pay | Admitting: Emergency Medicine

## 2014-08-18 LAB — BASIC METABOLIC PANEL
ANION GAP: 3 — AB (ref 7–16)
BUN: 18 mg/dL (ref 7–18)
CALCIUM: 9 mg/dL (ref 8.5–10.1)
Chloride: 105 mmol/L (ref 98–107)
Co2: 30 mmol/L (ref 21–32)
Creatinine: 0.7 mg/dL (ref 0.60–1.30)
EGFR (Non-African Amer.): >60
Glucose: 99 mg/dL (ref 65–99)
OSMOLALITY: 278 (ref 275–301)
POTASSIUM: 3.7 mmol/L (ref 3.5–5.1)
SODIUM: 138 mmol/L (ref 136–145)

## 2014-08-18 LAB — CBC
HCT: 39.6 % (ref 35.0–47.0)
HGB: 13.1 g/dL (ref 12.0–16.0)
MCH: 31.7 pg (ref 26.0–34.0)
MCHC: 33.1 g/dL (ref 32.0–36.0)
MCV: 96 fL (ref 80–100)
PLATELETS: 223 10*3/uL (ref 150–440)
RBC: 4.13 10*6/uL (ref 3.80–5.20)
RDW: 12.5 % (ref 11.5–14.5)
WBC: 6.2 10*3/uL (ref 3.6–11.0)

## 2014-08-26 ENCOUNTER — Emergency Department: Payer: Self-pay | Admitting: Emergency Medicine

## 2014-08-26 LAB — COMPREHENSIVE METABOLIC PANEL
ALT: 20 U/L
ANION GAP: 6 — AB (ref 7–16)
AST: 24 U/L (ref 15–37)
Albumin: 3.3 g/dL — ABNORMAL LOW (ref 3.4–5.0)
Alkaline Phosphatase: 87 U/L
BUN: 16 mg/dL (ref 7–18)
Bilirubin,Total: 0.1 mg/dL — ABNORMAL LOW (ref 0.2–1.0)
CALCIUM: 8.5 mg/dL (ref 8.5–10.1)
CHLORIDE: 105 mmol/L (ref 98–107)
Co2: 27 mmol/L (ref 21–32)
Creatinine: 1.01 mg/dL (ref 0.60–1.30)
Glucose: 83 mg/dL (ref 65–99)
OSMOLALITY: 276 (ref 275–301)
Potassium: 3.9 mmol/L (ref 3.5–5.1)
Sodium: 138 mmol/L (ref 136–145)
Total Protein: 6.8 g/dL (ref 6.4–8.2)

## 2014-08-26 LAB — CBC
HCT: 39.6 % (ref 35.0–47.0)
HGB: 13.4 g/dL (ref 12.0–16.0)
MCH: 31.8 pg (ref 26.0–34.0)
MCHC: 33.8 g/dL (ref 32.0–36.0)
MCV: 94 fL (ref 80–100)
PLATELETS: 153 10*3/uL (ref 150–440)
RBC: 4.21 10*6/uL (ref 3.80–5.20)
RDW: 12.3 % (ref 11.5–14.5)
WBC: 3.3 10*3/uL — ABNORMAL LOW (ref 3.6–11.0)

## 2014-08-26 LAB — URINALYSIS, COMPLETE
Bilirubin,UR: NEGATIVE
Blood: NEGATIVE
GLUCOSE, UR: NEGATIVE mg/dL (ref 0–75)
KETONE: NEGATIVE
Leukocyte Esterase: NEGATIVE
Nitrite: NEGATIVE
PH: 6 (ref 4.5–8.0)
Protein: NEGATIVE
Specific Gravity: 1.024 (ref 1.003–1.030)
Squamous Epithelial: 1

## 2014-08-26 LAB — INFLUENZA A,B,H1N1 - PCR (ARMC)
H1N1 flu by pcr: NOT DETECTED
INFLAPCR: NEGATIVE
Influenza B By PCR: NEGATIVE

## 2014-08-26 LAB — TROPONIN I: Troponin-I: <0.02 ng/mL

## 2015-07-28 ENCOUNTER — Ambulatory Visit: Payer: Self-pay | Admitting: Primary Care

## 2015-08-01 ENCOUNTER — Emergency Department
Admission: EM | Admit: 2015-08-01 | Discharge: 2015-08-01 | Disposition: A | Discharge status: Home / Self Care | Payer: BLUE CROSS/BLUE SHIELD | Attending: Emergency Medicine | Admitting: Emergency Medicine

## 2015-08-01 ENCOUNTER — Encounter: Payer: Self-pay | Admitting: Emergency Medicine

## 2015-08-01 ENCOUNTER — Emergency Department: Payer: BLUE CROSS/BLUE SHIELD

## 2015-08-01 DIAGNOSIS — Z79899 Other long term (current) drug therapy: Secondary | ICD-10-CM | POA: Insufficient documentation

## 2015-08-01 DIAGNOSIS — Z7952 Long term (current) use of systemic steroids: Secondary | ICD-10-CM | POA: Diagnosis not present

## 2015-08-01 DIAGNOSIS — N200 Calculus of kidney: Secondary | ICD-10-CM | POA: Insufficient documentation

## 2015-08-01 DIAGNOSIS — I10 Essential (primary) hypertension: Secondary | ICD-10-CM | POA: Insufficient documentation

## 2015-08-01 DIAGNOSIS — R319 Hematuria, unspecified: Secondary | ICD-10-CM

## 2015-08-01 DIAGNOSIS — R109 Unspecified abdominal pain: Secondary | ICD-10-CM | POA: Diagnosis present

## 2015-08-01 LAB — COMPREHENSIVE METABOLIC PANEL
ALBUMIN: 4.4 g/dL (ref 3.5–5.0)
ALT: 18 U/L (ref 14–54)
ANION GAP: 4 — AB (ref 5–15)
AST: 19 U/L (ref 15–41)
Alkaline Phosphatase: 91 U/L (ref 38–126)
BILIRUBIN TOTAL: 0.5 mg/dL (ref 0.3–1.2)
BUN: 23 mg/dL — ABNORMAL HIGH (ref 6–20)
CO2: 29 mmol/L (ref 22–32)
Calcium: 9.7 mg/dL (ref 8.9–10.3)
Chloride: 105 mmol/L (ref 101–111)
Creatinine, Ser: 0.77 mg/dL (ref 0.44–1.00)
GFR calc Af Amer: >60 mL/min (ref >60)
GFR calc non Af Amer: >60 mL/min (ref >60)
GLUCOSE: 102 mg/dL — AB (ref 65–99)
POTASSIUM: 3.9 mmol/L (ref 3.5–5.1)
SODIUM: 138 mmol/L (ref 135–145)
TOTAL PROTEIN: 7.4 g/dL (ref 6.5–8.1)

## 2015-08-01 LAB — LIPASE, BLOOD: Lipase: 83 U/L — ABNORMAL HIGH (ref 11–51)

## 2015-08-01 LAB — CBC WITH DIFFERENTIAL/PLATELET
BASOS ABS: 0 10*3/uL (ref 0–0.1)
BASOS PCT: 1 %
EOS ABS: 0.1 10*3/uL (ref 0–0.7)
Eosinophils Relative: 2 %
HCT: 41.6 % (ref 35.0–47.0)
HEMOGLOBIN: 14 g/dL (ref 12.0–16.0)
Lymphocytes Relative: 26 %
Lymphs Abs: 1.6 10*3/uL (ref 1.0–3.6)
MCH: 30.7 pg (ref 26.0–34.0)
MCHC: 33.6 g/dL (ref 32.0–36.0)
MCV: 91.4 fL (ref 80.0–100.0)
MONOS PCT: 6 %
Monocytes Absolute: 0.4 10*3/uL (ref 0.2–0.9)
NEUTROS ABS: 4 10*3/uL (ref 1.4–6.5)
NEUTROS PCT: 65 %
Platelets: 216 10*3/uL (ref 150–440)
RBC: 4.55 MIL/uL (ref 3.80–5.20)
RDW: 12.6 % (ref 11.5–14.5)
WBC: 6.2 10*3/uL (ref 3.6–11.0)

## 2015-08-01 LAB — URINALYSIS COMPLETE WITH MICROSCOPIC (ARMC ONLY)
BACTERIA UA: NONE SEEN
Bilirubin Urine: NEGATIVE
GLUCOSE, UA: NEGATIVE mg/dL
Ketones, ur: NEGATIVE mg/dL
LEUKOCYTES UA: NEGATIVE
NITRITE: NEGATIVE
Protein, ur: NEGATIVE mg/dL
SPECIFIC GRAVITY, URINE: 1.019 (ref 1.005–1.030)
pH: 6 (ref 5.0–8.0)

## 2015-08-01 MED ORDER — NAPROXEN 500 MG PO TABS: 500.0000 mg | ORAL_TABLET | Freq: Two times a day (BID) | ORAL | Status: DC

## 2015-08-01 MED ORDER — KETOROLAC TROMETHAMINE 30 MG/ML IJ SOLN
30.0000 mg | Freq: Once | INTRAMUSCULAR | Status: AC
  Administered 2015-08-01: 30 mg via INTRAVENOUS
  Filled 2015-08-01: qty 1

## 2015-08-01 MED ORDER — MORPHINE SULFATE (PF) 4 MG/ML IV SOLN
4.0000 mg | Freq: Once | INTRAVENOUS | Status: AC
  Administered 2015-08-01: 4 mg via INTRAVENOUS
  Filled 2015-08-01: qty 1

## 2015-08-01 MED ORDER — OXYCODONE-ACETAMINOPHEN 5-325 MG PO TABS: 1.0000 | ORAL_TABLET | Freq: Four times a day (QID) | ORAL | Status: DC | PRN

## 2015-08-01 MED ORDER — ONDANSETRON 8 MG PO TBDP: 8.0000 mg | ORAL_TABLET | Freq: Three times a day (TID) | ORAL | Status: DC | PRN

## 2015-08-01 MED ORDER — ONDANSETRON HCL 4 MG/2ML IJ SOLN
4.0000 mg | Freq: Once | INTRAMUSCULAR | Status: AC
  Administered 2015-08-01: 4 mg via INTRAVENOUS
  Filled 2015-08-01: qty 2

## 2015-08-01 NOTE — Discharge Instructions (Signed)
You were prescribed a medication that is potentially sedating. Do not drink alcohol, drive or participate in any other potentially dangerous activities while taking this medication as it may make you sleepy. Do not take this medication with any other sedating medications, either prescription or over-the-counter. If you were prescribed Percocet or Vicodin, do not take these with acetaminophen (Tylenol) as it is already contained within these medications.   Opioid pain medications (or "narcotics") can be habit forming.  Use it as little as possible to achieve adequate pain control.  Do not use or use it with extreme caution if you have a history of opiate abuse or dependence.  If you are on a pain contract with your primary care doctor or a pain specialist, be sure to let them know you were prescribed this medication today from the [facility] Emergency Department.  This medication is intended for your use only - do not give any to anyone else and keep it in a secure place where nobody else, especially children and pets, have access to it.  It will also cause or worsen constipation, so you may want to consider taking an over-the-counter stool softener while you are taking this medication.  Your CT scan shows 2 small stones in the right kidney. The largest one is 3 mm. Please follow-up with urology due to your ongoing pain and blood in the urine with these stones. Hematuria, Adult Hematuria is blood in your urine. It can be caused by a bladder infection, kidney infection, prostate infection, kidney stone, or cancer of your urinary tract. Infections can usually be treated with medicine, and a kidney stone usually will pass through your urine. If neither of these is the cause of your hematuria, further workup to find out the reason may be needed. It is very important that you tell your health care provider about any blood you see in your urine, even if the blood stops without treatment or happens without  causing pain. Blood in your urine that happens and then stops and then happens again can be a symptom of a very serious condition. Also, pain is not a symptom in the initial stages of many urinary cancers. HOME CARE INSTRUCTIONS   Drink lots of fluid, 3-4 quarts a day. If you have been diagnosed with an infection, cranberry juice is especially recommended, in addition to large amounts of water.  Avoid caffeine, tea, and carbonated beverages because they tend to irritate the bladder.  Avoid alcohol because it may irritate the prostate.  Take all medicines as directed by your health care provider.  If you were prescribed an antibiotic medicine, finish it all even if you start to feel better.  If you have been diagnosed with a kidney stone, follow your health care provider's instructions regarding straining your urine to catch the stone.  Empty your bladder often. Avoid holding urine for long periods of time.  After a bowel movement, women should cleanse front to back. Use each tissue only once.  Empty your bladder before and after sexual intercourse if you are a female. SEEK MEDICAL CARE IF:  You develop back pain.  You have a fever.  You have a feeling of sickness in your stomach (nausea) or vomiting.  Your symptoms are not better in 3 days. Return sooner if you are getting worse. SEEK IMMEDIATE MEDICAL CARE IF:   You develop severe vomiting and are unable to keep the medicine down.  You develop severe back or abdominal pain despite taking your medicines.  You begin passing a large amount of blood or clots in your urine.  You feel extremely weak or faint, or you pass out. MAKE SURE YOU:   Understand these instructions.  Will watch your condition.  Will get help right away if you are not doing well or get worse.   This information is not intended to replace advice given to you by your health care provider. Make sure you discuss any questions you have with your health care  provider.   Document Released: 08/22/2005 Document Revised: 09/12/2014 Document Reviewed: 04/22/2013 Elsevier Interactive Patient Education 2016 [employer].  Kidney Stones Kidney stones (urolithiasis) are deposits that form inside your kidneys. The intense pain is caused by the stone moving through the urinary tract. When the stone moves, the ureter goes into spasm around the stone. The stone is usually passed in the urine.  CAUSES   A disorder that makes certain neck glands produce too much parathyroid hormone (primary hyperparathyroidism).  A buildup of uric acid crystals, similar to gout in your joints.  Narrowing (stricture) of the ureter.  A kidney obstruction present at birth (congenital obstruction).  Previous surgery on the kidney or ureters.  Numerous kidney infections. SYMPTOMS   Feeling sick to your stomach (nauseous).  Throwing up (vomiting).  Blood in the urine (hematuria).  Pain that usually spreads (radiates) to the groin.  Frequency or urgency of urination. DIAGNOSIS   Taking a history and physical exam.  Blood or urine tests.  CT scan.  Occasionally, an examination of the inside of the urinary bladder (cystoscopy) is performed. TREATMENT   Observation.  Increasing your fluid intake.  Extracorporeal shock wave lithotripsy--This is a noninvasive procedure that uses shock waves to break up kidney stones.  Surgery may be needed if you have severe pain or persistent obstruction. There are various surgical procedures. Most of the procedures are performed with the use of small instruments. Only small incisions are needed to accommodate these instruments, so recovery time is minimized. The size, location, and chemical composition are all important variables that will determine the proper choice of action for you. Talk to your health care provider to better understand your situation so that you will minimize the risk of injury to yourself and your kidney.    HOME CARE INSTRUCTIONS   Drink enough water and fluids to keep your urine clear or pale yellow. This will help you to pass the stone or stone fragments.  Strain all urine through the provided strainer. Keep all particulate matter and stones for your health care provider to see. The stone causing the pain may be as small as a grain of salt. It is very important to use the strainer each and every time you pass your urine. The collection of your stone will allow your health care provider to analyze it and verify that a stone has actually passed. The stone analysis will often identify what you can do to reduce the incidence of recurrences.  Only take over-the-counter or prescription medicines for pain, discomfort, or fever as directed by your health care provider.  Keep all follow-up visits as told by your health care provider. This is important.  Get follow-up X-rays if required. The absence of pain does not always mean that the stone has passed. It may have only stopped moving. If the urine remains completely obstructed, it can cause loss of kidney function or even complete destruction of the kidney. It is your responsibility to make sure X-rays and follow-ups are completed. Ultrasounds  of the kidney can show blockages and the status of the kidney. Ultrasounds are not associated with any radiation and can be performed easily in a matter of minutes.  Make changes to your daily diet as told by your health care provider. You may be told to:  Limit the amount of salt that you eat.  Eat 5 or more servings of fruits and vegetables each day.  Limit the amount of meat, poultry, fish, and eggs that you eat.  Collect a 24-hour urine sample as told by your health care provider.You may need to collect another urine sample every 6-12 months. SEEK MEDICAL CARE IF:  You experience pain that is progressive and unresponsive to any pain medicine you have been prescribed. SEEK IMMEDIATE MEDICAL CARE IF:    Pain cannot be controlled with the prescribed medicine.  You have a fever or shaking chills.  The severity or intensity of pain increases over 18 hours and is not relieved by pain medicine.  You develop a new onset of abdominal pain.  You feel faint or pass out.  You are unable to urinate.   This information is not intended to replace advice given to you by your health care provider. Make sure you discuss any questions you have with your health care provider.   Document Released: 08/22/2005 Document Revised: 05/13/2015 Document Reviewed: 01/23/2013 Elsevier Interactive Patient Education [employer].

## 2015-08-01 NOTE — ED Notes (Signed)
States chills one day, was seen by PMD who gave her cipro for blood in urine

## 2015-08-01 NOTE — ED Provider Notes (Signed)
[facility] Emergency Department Provider Note  ____________________________________________  Time seen: 2:30 PM  I have reviewed the triage vital signs and the nursing notes.   HISTORY  Chief Complaint Flank Pain    HPI Virginia [name] is a 49 y.o. female who complains of colicky right flank pain for the past 2 weeks with hematuria for one week. The pain is intermittent, lasts for several minutes at a time, nonradiating, no aggravating or alleviating factors. No associated symptoms except for chills yesterday. No fever vomiting chest pain or shortness of breath.     Past Medical History  Diagnosis Date  . Collar bone fracture   . Hypertension      There are no active problems to display for this patient.    Past Surgical History  Procedure Laterality Date  . Leg surgery    . Cesarean section    . Abdominal hysterectomy       Current Outpatient Rx  Name  Route  Sig  Dispense  Refill  . naproxen (NAPROSYN) 500 MG tablet   Oral   Take 1 tablet (500 mg total) by mouth 2 (two) times daily with a meal.   20 tablet   0   . ondansetron (ZOFRAN ODT) 8 MG disintegrating tablet   Oral   Take 1 tablet (8 mg total) by mouth every 8 (eight) hours as needed for nausea or vomiting.   20 tablet   0   . oxyCODONE-acetaminophen (ROXICET) 5-325 MG tablet   Oral   Take 1 tablet by mouth every 6 (six) hours as needed for severe pain.   12 tablet   0   . predniSONE (DELTASONE) 20 MG tablet   Oral   Take 2 tablets (40 mg total) by mouth daily.   10 tablet   0   . sertraline (ZOLOFT) 50 MG tablet   Oral   Take 50 mg by mouth daily.            Allergies Inapsine and Percocet   No family history on file.  Social History Social History  Substance Use Topics  . Smoking status: Never Smoker   . Smokeless tobacco: None  . Alcohol Use: No    Review of Systems  Constitutional:   No fever or chills. No weight changes Eyes:   No blurry  vision or double vision.  ENT:   No sore throat. Cardiovascular:   No chest pain. Respiratory:   No dyspnea or cough. Gastrointestinal:   Negative for abdominal pain, vomiting and diarrhea.  No BRBPR or melena. Genitourinary:   Negative for dysuria, urinary retention,  or difficulty urinating. Positive hematuria Musculoskeletal:   Positive right flank pain . Skin:   Negative for rash. Neurological:   Negative for headaches, focal weakness or numbness. Psychiatric:  No anxiety or depression.   Endocrine:  No hot/cold intolerance, changes in energy, or sleep difficulty.  10-point ROS otherwise negative.  ____________________________________________   PHYSICAL EXAM:  VITAL SIGNS: ED Triage Vitals  Enc Vitals Group     BP 08/01/15 1301 112/80 mmHg     Pulse Rate 08/01/15 1301 72     Resp 08/01/15 1301 18     Temp 08/01/15 1301 98.3 F (36.8 C)     Temp Source 08/01/15 1301 Oral     SpO2 08/01/15 1301 96 %     Weight 08/01/15 1301 170 lb (77.111 kg)     Height 08/01/15 1301 5\' 8"  (1.727 m)  Head Cir --      Peak Flow --      Pain Score 08/01/15 1302 9     Pain Loc --      Pain Edu? --      Excl. in [place]? --      Constitutional:   Alert and oriented. Well appearing and in no distress. Eyes:   No scleral icterus. No conjunctival pallor. PERRL. EOMI ENT   Head:   Normocephalic and atraumatic.   Nose:   No congestion/rhinnorhea. No septal hematoma   Mouth/Throat:   MMM, no pharyngeal erythema. No peritonsillar mass. No uvula shift.   Neck:   No stridor. No SubQ emphysema. No meningismus. Hematological/Lymphatic/Immunilogical:   No cervical lymphadenopathy. Cardiovascular:   RRR. Normal and symmetric distal pulses are present in all extremities. No murmurs, rubs, or gallops. Respiratory:   Normal respiratory effort without tachypnea nor retractions. Breath sounds are clear and equal bilaterally. No wheezes/rales/rhonchi. Gastrointestinal:   Soft and nontender.  No distention. There is no CVA tenderness.  No rebound, rigidity, or guarding. Genitourinary:   deferred Musculoskeletal:   Nontender with normal range of motion in all extremities. No joint effusions.  No lower extremity tenderness.  No edema. Neurologic:   Normal speech and language.  CN 2-10 normal. Motor grossly intact. No pronator drift.  Normal gait. No gross focal neurologic deficits are appreciated.  Skin:    Skin is warm, dry and intact. No rash noted.  No petechiae, purpura, or bullae. Psychiatric:   Mood and affect are normal. Speech and behavior are normal. Patient exhibits appropriate insight and judgment.  ____________________________________________    LABS (pertinent positives/negatives) (all labs ordered are listed, but only abnormal results are displayed) Labs Reviewed  COMPREHENSIVE METABOLIC PANEL - Abnormal; Notable for the following:    Glucose, Bld 102 (*)    BUN 23 (*)    Anion gap 4 (*)    All other components within normal limits  LIPASE, BLOOD - Abnormal; Notable for the following:    Lipase 83 (*)    All other components within normal limits  URINALYSIS COMPLETEWITH MICROSCOPIC ([facility] ONLY) - Abnormal; Notable for the following:    Color, Urine YELLOW (*)    APPearance CLEAR (*)    Hgb urine dipstick 2+ (*)    Squamous Epithelial / LPF 0-5 (*)    All other components within normal limits  CBC WITH DIFFERENTIAL/PLATELET   ____________________________________________   EKG    ____________________________________________    [facility] CT abdomen and pelvis reveals 2 small right kidney stones in the right kidney. No ureterolithiasis or hydronephrosis.______________   PROCEDURES   ____________________________________________   INITIAL IMPRESSION / ASSESSMENT AND PLAN / ED COURSE  Pertinent labs & imaging results that were available during my care of the patient were reviewed by me and considered in my medical decision making (see chart for  details).  Patient presents with symptoms consistent with renal colic. With prolonged course we'll get a CT scan. IV Toradol and Zofran for pain.  ----------------------------------------- 3:41 PM on 08/01/2015 -----------------------------------------  Workup negative except for 2 small stones within the right kidney. These appear to be causing the hematuria and the pain. We'll have her follow up with urology given the prolonged course. Zofran Percocet and naproxen for now. Medically stable, suitable for outpatient follow-up. Low Suspicion for torsion ectopic or appendicitis.     ____________________________________________   FINAL CLINICAL IMPRESSION(S) / ED DIAGNOSES  Final diagnoses:  Right nephrolithiasis  Hematuria  Carrie Mew, MD 08/01/15 [phone]

## 2015-08-04 ENCOUNTER — Encounter: Payer: Self-pay | Admitting: Urology

## 2015-08-04 ENCOUNTER — Ambulatory Visit (INDEPENDENT_AMBULATORY_CARE_PROVIDER_SITE_OTHER): Payer: BLUE CROSS/BLUE SHIELD | Admitting: Urology

## 2015-08-04 VITALS — BP 113/76 | HR 76 | Ht 68.0 in | Wt 175.1 lb

## 2015-08-04 DIAGNOSIS — N200 Calculus of kidney: Secondary | ICD-10-CM | POA: Diagnosis not present

## 2015-08-04 DIAGNOSIS — R310 Gross hematuria: Secondary | ICD-10-CM

## 2015-08-04 LAB — URINALYSIS, COMPLETE
BILIRUBIN UA: NEGATIVE
Glucose, UA: NEGATIVE
KETONES UA: NEGATIVE
Leukocytes, UA: NEGATIVE
NITRITE UA: NEGATIVE
Protein, UA: NEGATIVE
UUROB: 0.2 mg/dL (ref 0.2–1.0)
pH, UA: 5.5 (ref 5.0–7.5)

## 2015-08-04 LAB — MICROSCOPIC EXAMINATION
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
## 2015-08-05 ENCOUNTER — Ambulatory Visit
Admission: RE | Admit: 2015-08-05 | Discharge: 2015-08-05 | Disposition: A | Discharge status: Home / Self Care | Payer: BLUE CROSS/BLUE SHIELD | Source: Ambulatory Visit | Attending: Urology | Admitting: Urology

## 2015-08-05 DIAGNOSIS — N200 Calculus of kidney: Secondary | ICD-10-CM | POA: Diagnosis not present

## 2015-08-05 DIAGNOSIS — R310 Gross hematuria: Secondary | ICD-10-CM | POA: Insufficient documentation

## 2015-08-05 MED ORDER — IOHEXOL 300 MG/ML  SOLN
125.0000 mL | Freq: Once | INTRAMUSCULAR | Status: AC | PRN
  Administered 2015-08-05: 125 mL via INTRAVENOUS

## 2015-08-06 ENCOUNTER — Encounter: Payer: Self-pay | Admitting: Urology

## 2015-08-06 ENCOUNTER — Ambulatory Visit (INDEPENDENT_AMBULATORY_CARE_PROVIDER_SITE_OTHER): Payer: 59 | Admitting: Urology

## 2015-08-06 VITALS — BP 113/74 | HR 75 | Ht 68.0 in | Wt 173.6 lb

## 2015-08-06 DIAGNOSIS — R310 Gross hematuria: Secondary | ICD-10-CM

## 2015-08-06 LAB — URINALYSIS, COMPLETE
BILIRUBIN UA: NEGATIVE
Glucose, UA: NEGATIVE
KETONES UA: NEGATIVE
Leukocytes, UA: NEGATIVE
NITRITE UA: NEGATIVE
PH UA: 6 (ref 5.0–7.5)
Protein, UA: NEGATIVE
SPEC GRAV UA: 1.025 (ref 1.005–1.030)
Urobilinogen, Ur: 0.2 mg/dL (ref 0.2–1.0)

## 2015-08-06 LAB — MICROSCOPIC EXAMINATION

## 2015-08-06 LAB — CULTURE, URINE COMPREHENSIVE

## 2015-08-06 MED ORDER — LIDOCAINE HCL 2 % EX GEL
1.0000 "application " | Freq: Once | CUTANEOUS | Status: AC
  Administered 2015-08-06: 1 via URETHRAL

## 2015-08-06 MED ORDER — CIPROFLOXACIN HCL 500 MG PO TABS
500.0000 mg | ORAL_TABLET | Freq: Once | ORAL | Status: AC
  Administered 2015-08-06: 500 mg via ORAL
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
## 2015-08-07 ENCOUNTER — Other Ambulatory Visit: Payer: BLUE CROSS/BLUE SHIELD

## 2015-08-07 ENCOUNTER — Telehealth: Payer: Self-pay | Admitting: Radiology

## 2015-08-07 NOTE — Telephone Encounter (Signed)
Pt notified of surgery scheduled 08/17/15, pre-admit testing appt on 12/5 @3 :00 and to call Friday prior to surgery for arrival time to SDS. Pt voices understanding.

## 2015-08-10 ENCOUNTER — Telehealth: Payer: Self-pay | Admitting: Radiology

## 2015-08-10 ENCOUNTER — Encounter: Payer: Self-pay | Admitting: Urology

## 2015-08-10 ENCOUNTER — Inpatient Hospital Stay: Admission: RE | Admit: 2015-08-10 | Payer: BLUE CROSS/BLUE SHIELD | Source: Ambulatory Visit

## 2015-08-10 ENCOUNTER — Telehealth: Payer: Self-pay

## 2015-08-10 NOTE — Telephone Encounter (Signed)
Spoke with pt in reference to ucx. Pt voiced understanding. Pt stated she needs a note to return to work. Pt was transferred to [facility] for note.

## 2015-08-10 NOTE — Telephone Encounter (Signed)
Pt notified she missed pre-admit testing appt on 12/5. Appt r/s to 12/6 @3 :00. Pt voices understanding.

## 2015-08-10 NOTE — Telephone Encounter (Signed)
-----   Message from Nori Riis, PA-C sent at 08/09/2015  7:06 PM EST ----- Patient's urine culture is negative.  Please let her know.

## 2015-08-11 ENCOUNTER — Encounter
Admission: RE | Admit: 2015-08-11 | Discharge: 2015-08-11 | Disposition: A | Discharge status: Home / Self Care | Payer: 59 | Source: Ambulatory Visit | Attending: Urology | Admitting: Urology

## 2015-08-11 DIAGNOSIS — Z01812 Encounter for preprocedural laboratory examination: Secondary | ICD-10-CM | POA: Diagnosis not present

## 2015-08-11 HISTORY — DX: Other complications of anesthesia, initial encounter: T88.59XA

## 2015-08-11 HISTORY — DX: Nausea with vomiting, unspecified: R11.2

## 2015-08-11 HISTORY — DX: Adverse effect of unspecified anesthetic, initial encounter: T41.45XA

## 2015-08-11 HISTORY — DX: Other specified postprocedural states: Z98.890

## 2015-08-11 LAB — APTT: aPTT: 32 seconds (ref 24–36)

## 2015-08-11 LAB — BASIC METABOLIC PANEL
ANION GAP: 6 (ref 5–15)
BUN: 15 mg/dL (ref 6–20)
CHLORIDE: 104 mmol/L (ref 101–111)
CO2: 29 mmol/L (ref 22–32)
CREATININE: 0.64 mg/dL (ref 0.44–1.00)
Calcium: 9.6 mg/dL (ref 8.9–10.3)
GFR calc non Af Amer: >60 mL/min (ref >60)
Glucose, Bld: 93 mg/dL (ref 65–99)
POTASSIUM: 3.6 mmol/L (ref 3.5–5.1)
SODIUM: 139 mmol/L (ref 135–145)

## 2015-08-11 LAB — CBC
HCT: 41.1 % (ref 35.0–47.0)
Hemoglobin: 13.9 g/dL (ref 12.0–16.0)
MCH: 31.2 pg (ref 26.0–34.0)
MCHC: 33.9 g/dL (ref 32.0–36.0)
MCV: 92.1 fL (ref 80.0–100.0)
PLATELETS: 199 10*3/uL (ref 150–440)
RBC: 4.46 MIL/uL (ref 3.80–5.20)
RDW: 12.5 % (ref 11.5–14.5)
WBC: 6 10*3/uL (ref 3.6–11.0)

## 2015-08-11 LAB — DIFFERENTIAL
BASOS ABS: 0 10*3/uL (ref 0–0.1)
Basophils Relative: 1 %
Eosinophils Absolute: 0.1 10*3/uL (ref 0–0.7)
Eosinophils Relative: 2 %
LYMPHS PCT: 22 %
Lymphs Abs: 1.3 10*3/uL (ref 1.0–3.6)
Monocytes Absolute: 0.3 10*3/uL (ref 0.2–0.9)
Monocytes Relative: 5 %
NEUTROS ABS: 4.2 10*3/uL (ref 1.4–6.5)
NEUTROS PCT: 70 %

## 2015-08-11 LAB — PROTIME-INR
INR: 1.06
PROTHROMBIN TIME: 14 s (ref 11.4–15.0)

## 2015-08-11 NOTE — Patient Instructions (Signed)
  Your procedure is scheduled on: Monday Dec. 12, 2016. Report to Same Day Surgery. To find out your arrival time please call [phone] between 1PM - 3PM on Friday Dec.9, 2016.  Remember: Instructions that are not followed completely may result in serious medical risk, up to and including death, or upon the discretion of your surgeon and anesthesiologist your surgery may need to be rescheduled.    _x___ 1. Do not eat food or drink liquids after midnight. No gum chewing or hard candies.    _x___ 2. No Alcohol for 24 hours before or after surgery.   ____ 3. Bring all medications with you on the day of surgery if instructed.    __x__ 4. Notify your doctor if there is any change in your medical condition     (cold, fever, infections).     Do not wear jewelry, make-up, hairpins, clips or nail polish.  Do not wear lotions, powders, or perfumes. You may wear deodorant.  Do not shave 48 hours prior to surgery. Men may shave face and neck.  Do not bring valuables to the hospital.    [facility] is not responsible for any belongings or valuables.               Contacts, dentures or bridgework may not be worn into surgery.  Leave your suitcase in the car. After surgery it may be brought to your room.  For patients admitted to the hospital, discharge time is determined by your treatment team.   Patients discharged the day of surgery will not be allowed to drive home.    Please read over the following fact sheets that you were given:   [facility] Preparing for Surgery  ____ Take these medicines the morning of surgery with A SIP OF WATER: NONE    ____ Fleet Enema (as directed)   ____ Use CHG Soap as directed  ____ Use inhalers on the day of surgery  ____ Stop metformin 2 days prior to surgery    ____ Take 1/2 of usual insulin dose the night before surgery and none on the morning of surgery.   ____ Stop Coumadin/Plavix/aspirin on does not apply.  __x__ Stop Anti-inflammatories  such as ibuprofen, advil, motril, aleve or aspirin products now.  May take Tylenol for pain.   ____ Stop supplements until after surgery.    ____ Bring C-Pap to the hospital.

## 2015-08-13 ENCOUNTER — Other Ambulatory Visit: Payer: BLUE CROSS/BLUE SHIELD

## 2015-08-13 LAB — URINE CULTURE
Culture: 2000
Special Requests: NORMAL

## 2015-08-17 ENCOUNTER — Encounter: Payer: Self-pay | Admitting: *Deleted

## 2015-08-17 ENCOUNTER — Ambulatory Visit: Payer: Commercial Managed Care - HMO | Admitting: Certified Registered Nurse Anesthetist

## 2015-08-17 ENCOUNTER — Telehealth: Payer: Self-pay

## 2015-08-17 ENCOUNTER — Encounter
Admission: RE | Disposition: A | Discharge status: Home / Self Care | Payer: Self-pay | Source: Ambulatory Visit | Attending: Urology

## 2015-08-17 ENCOUNTER — Ambulatory Visit
Admission: RE | Admit: 2015-08-17 | Discharge: 2015-08-17 | Disposition: A | Discharge status: Home / Self Care | Payer: Commercial Managed Care - HMO | Source: Ambulatory Visit | Attending: Urology | Admitting: Urology

## 2015-08-17 DIAGNOSIS — N329 Bladder disorder, unspecified: Secondary | ICD-10-CM | POA: Insufficient documentation

## 2015-08-17 DIAGNOSIS — Z888 Allergy status to other drugs, medicaments and biological substances status: Secondary | ICD-10-CM | POA: Insufficient documentation

## 2015-08-17 DIAGNOSIS — Z885 Allergy status to narcotic agent status: Secondary | ICD-10-CM | POA: Diagnosis not present

## 2015-08-17 DIAGNOSIS — F329 Major depressive disorder, single episode, unspecified: Secondary | ICD-10-CM | POA: Insufficient documentation

## 2015-08-17 DIAGNOSIS — Z79899 Other long term (current) drug therapy: Secondary | ICD-10-CM | POA: Diagnosis not present

## 2015-08-17 DIAGNOSIS — Z90710 Acquired absence of both cervix and uterus: Secondary | ICD-10-CM | POA: Diagnosis not present

## 2015-08-17 DIAGNOSIS — Z87442 Personal history of urinary calculi: Secondary | ICD-10-CM | POA: Insufficient documentation

## 2015-08-17 DIAGNOSIS — I10 Essential (primary) hypertension: Secondary | ICD-10-CM | POA: Insufficient documentation

## 2015-08-17 DIAGNOSIS — R310 Gross hematuria: Secondary | ICD-10-CM | POA: Insufficient documentation

## 2015-08-17 DIAGNOSIS — Z87891 Personal history of nicotine dependence: Secondary | ICD-10-CM | POA: Insufficient documentation

## 2015-08-17 DIAGNOSIS — D494 Neoplasm of unspecified behavior of bladder: Secondary | ICD-10-CM | POA: Diagnosis not present

## 2015-08-17 DIAGNOSIS — E119 Type 2 diabetes mellitus without complications: Secondary | ICD-10-CM | POA: Diagnosis not present

## 2015-08-17 HISTORY — PX: CYSTOSCOPY W/ RETROGRADES: SHX1426

## 2015-08-17 HISTORY — PX: CYSTOSCOPY WITH BIOPSY: SHX5122

## 2015-08-17 SURGERY — CYSTOSCOPY, WITH BIOPSY
Anesthesia: General | Laterality: Right | Wound class: Clean Contaminated

## 2015-08-17 MED ORDER — SCOPOLAMINE 1 MG/3DAYS TD PT72
1.0000 | MEDICATED_PATCH | Freq: Once | TRANSDERMAL | Status: DC
  Administered 2015-08-17: 1.5 mg via TRANSDERMAL

## 2015-08-17 MED ORDER — PROPOFOL 10 MG/ML IV BOLUS
INTRAVENOUS | Status: DC | PRN
  Administered 2015-08-17: 120 mg via INTRAVENOUS

## 2015-08-17 MED ORDER — CEFAZOLIN SODIUM-DEXTROSE 2-3 GM-% IV SOLR
2.0000 g | Freq: Once | INTRAVENOUS | Status: AC
  Administered 2015-08-17: 2 g via INTRAVENOUS

## 2015-08-17 MED ORDER — FENTANYL CITRATE (PF) 100 MCG/2ML IJ SOLN
25.0000 ug | INTRAMUSCULAR | Status: DC | PRN
  Administered 2015-08-17 (×4): 25 ug via INTRAVENOUS

## 2015-08-17 MED ORDER — HYDROCODONE-ACETAMINOPHEN 5-325 MG PO TABS
1.0000 | ORAL_TABLET | Freq: Four times a day (QID) | ORAL | Status: DC | PRN
  Administered 2015-08-17: 1 via ORAL

## 2015-08-17 MED ORDER — EPHEDRINE SULFATE 50 MG/ML IJ SOLN
INTRAMUSCULAR | Status: DC | PRN
  Administered 2015-08-17: 5 mg via INTRAVENOUS

## 2015-08-17 MED ORDER — SCOPOLAMINE 1 MG/3DAYS TD PT72
MEDICATED_PATCH | TRANSDERMAL | Status: AC
  Administered 2015-08-17: 1.5 mg via TRANSDERMAL
  Filled 2015-08-17: qty 1

## 2015-08-17 MED ORDER — CEFAZOLIN SODIUM-DEXTROSE 2-3 GM-% IV SOLR
INTRAVENOUS | Status: AC
  Administered 2015-08-17: 2 g via INTRAVENOUS
  Filled 2015-08-17: qty 50

## 2015-08-17 MED ORDER — ONDANSETRON HCL 4 MG/2ML IJ SOLN
4.0000 mg | Freq: Once | INTRAMUSCULAR | Status: DC | PRN
Start: 2015-08-17 — End: 2015-08-17

## 2015-08-17 MED ORDER — MIDAZOLAM HCL 2 MG/2ML IJ SOLN
INTRAMUSCULAR | Status: DC | PRN
  Administered 2015-08-17: 2 mg via INTRAVENOUS

## 2015-08-17 MED ORDER — FENTANYL CITRATE (PF) 100 MCG/2ML IJ SOLN
INTRAMUSCULAR | Status: AC
  Administered 2015-08-17: 25 ug via INTRAVENOUS
  Filled 2015-08-17: qty 2

## 2015-08-17 MED ORDER — HYDROCODONE-ACETAMINOPHEN 5-325 MG PO TABS: 1.0000 | ORAL_TABLET | Freq: Four times a day (QID) | ORAL | Status: DC | PRN

## 2015-08-17 MED ORDER — LACTATED RINGERS IV SOLN
INTRAVENOUS | Status: DC
  Administered 2015-08-17: 13:00:00 via INTRAVENOUS

## 2015-08-17 MED ORDER — FAMOTIDINE 20 MG PO TABS
ORAL_TABLET | ORAL | Status: AC
  Administered 2015-08-17: 20 mg via ORAL
  Filled 2015-08-17: qty 1

## 2015-08-17 MED ORDER — ACETAMINOPHEN 10 MG/ML IV SOLN
INTRAVENOUS | Status: AC
  Filled 2015-08-17: qty 100

## 2015-08-17 MED ORDER — IOTHALAMATE MEGLUMINE 43 % IV SOLN
INTRAVENOUS | Status: DC | PRN
  Administered 2015-08-17: 15 mL

## 2015-08-17 MED ORDER — FENTANYL CITRATE (PF) 100 MCG/2ML IJ SOLN
INTRAMUSCULAR | Status: DC | PRN
  Administered 2015-08-17: 100 ug via INTRAVENOUS

## 2015-08-17 MED ORDER — LIDOCAINE HCL (CARDIAC) 20 MG/ML IV SOLN
INTRAVENOUS | Status: DC | PRN
  Administered 2015-08-17: 100 mg via INTRAVENOUS

## 2015-08-17 MED ORDER — FAMOTIDINE 20 MG PO TABS
20.0000 mg | ORAL_TABLET | Freq: Once | ORAL | Status: AC
Start: 2015-08-17 — End: 2015-08-17
  Administered 2015-08-17: 20 mg via ORAL

## 2015-08-17 MED ORDER — HYDROCODONE-ACETAMINOPHEN 5-325 MG PO TABS
ORAL_TABLET | ORAL | Status: AC
  Filled 2015-08-17: qty 1

## 2015-08-17 SURGICAL SUPPLY — 20 items
BACTOSHIELD CHG 4% 4OZ (MISCELLANEOUS) ×2
CATH URETL 5X70 OPEN END (CATHETERS) ×4 IMPLANT
CONRAY 43 FOR UROLOGY 50M (MISCELLANEOUS) ×4 IMPLANT
FORCEPS BIOP PIRANHA Y (CUTTING FORCEPS) ×4 IMPLANT
GLOVE BIO SURGEON STRL SZ7 (GLOVE) ×8 IMPLANT
GLOVE BIO SURGEON STRL SZ7.5 (GLOVE) ×4 IMPLANT
GOWN L4 LG 24 PK N/S (GOWN DISPOSABLE) ×4 IMPLANT
GOWN STRL REUS W/ TWL LRG LVL3 (GOWN DISPOSABLE) ×2 IMPLANT
GOWN STRL REUS W/ TWL XL LVL3 (GOWN DISPOSABLE) ×2 IMPLANT
GOWN STRL REUS W/TWL LRG LVL3 (GOWN DISPOSABLE) ×2
GOWN STRL REUS W/TWL XL LVL3 (GOWN DISPOSABLE) ×6 IMPLANT
KIT RM TURNOVER CYSTO AR (KITS) ×4 IMPLANT
PACK CYSTO AR (MISCELLANEOUS) ×4 IMPLANT
SCRUB CHG 4% DYNA-HEX 4OZ (MISCELLANEOUS) ×2 IMPLANT
SENSORWIRE 0.038 NOT ANGLED (WIRE) ×4
SET CYSTO W/LG BORE CLAMP LF (SET/KITS/TRAYS/PACK) ×4 IMPLANT
SOL .9 NS 3000ML IRR  AL (IV SOLUTION) ×2
SOL .9 NS 3000ML IRR UROMATIC (IV SOLUTION) ×2 IMPLANT
SURGILUBE 2OZ TUBE FLIPTOP (MISCELLANEOUS) ×4 IMPLANT
WIRE SENSOR 0.038 NOT ANGLED (WIRE) ×2 IMPLANT

## 2015-08-17 NOTE — Interval H&P Note (Signed)
History and Physical Interval Note:  08/17/2015 12:24 PM  Virginia [name]  has presented today for surgery, with the diagnosis of BLADDER LESION  The various methods of treatment have been discussed with the patient and family. After consideration of risks, benefits and other options for treatment, the patient has consented to  Procedure(s): CYSTOSCOPY WITH BIOPSY (N/A) CYSTOSCOPY WITH RETROGRADE PYELOGRAM (Right) as a surgical intervention .  The patient's history has been reviewed, patient examined, no change in status, stable for surgery.  I have reviewed the patient's chart and labs.  Questions were answered to the patient's satisfaction.    RRR Unlabored respirations  Horald Pollen [place]

## 2015-08-17 NOTE — Transfer of Care (Signed)
Immediate Anesthesia Transfer of Care Note  Patient: Virginia [name]  Procedure(s) Performed: Procedure(s): CYSTOSCOPY WITH BIOPSY (N/A) CYSTOSCOPY WITH RETROGRADE PYELOGRAM (Right)  Patient Location: PACU  Anesthesia Type:General  Level of Consciousness: sedated  Airway & Oxygen Therapy: Patient Spontanous Breathing and Patient connected to nasal cannula oxygen  Post-op Assessment: Report given to RN and Post -op Vital signs reviewed and stable  Post vital signs: Reviewed and stable  Last Vitals:  Filed Vitals:   08/17/15 1246 08/17/15 1351  BP: 112/83 120/82  Pulse: 81 66  Temp: 36.7 C 36.1 C  Resp: 16 12    Complications: No apparent anesthesia complications

## 2015-08-17 NOTE — Anesthesia Procedure Notes (Signed)
Procedure Name: LMA Insertion Date/Time: 08/17/2015 1:20 PM Performed by: Rosaria Ferries, [name] Pre-anesthesia Checklist: Patient identified, Emergency Drugs available, Suction available and Patient being monitored Patient Re-evaluated:Patient Re-evaluated prior to inductionOxygen Delivery Method: Circle system utilized Preoxygenation: Pre-oxygenation with 100% oxygen Intubation Type: IV induction LMA: LMA inserted LMA Size: 4.0 Number of attempts: 1 Placement Confirmation: breath sounds checked- equal and bilateral Dental Injury: Teeth and Oropharynx as per pre-operative assessment

## 2015-08-17 NOTE — Telephone Encounter (Signed)
-----   Message from Nickie Retort, MD sent at 08/17/2015  1:48 PM EST ----- Patient needs to f/u in 1-2 weeks for biopsy results.  Thanks.

## 2015-08-17 NOTE — Discharge Instructions (Signed)
General Anesthesia, Adult, Care After Refer to this sheet in the next few weeks. These instructions provide you with information on caring for yourself after your procedure. Your health care provider may also give you more specific instructions. Your treatment has been planned according to current medical practices, but problems sometimes occur. Call your health care provider if you have any problems or questions after your procedure. WHAT TO EXPECT AFTER THE PROCEDURE After the procedure, it is typical to experience:  Sleepiness.  Nausea and vomiting. HOME CARE INSTRUCTIONS  For the first 24 hours after general anesthesia:  Have a responsible person with you.  Do not drive a car. If you are alone, do not take public transportation.  Do not drink alcohol.  Do not take medicine that has not been prescribed by your health care provider.  Do not sign important papers or make important decisions.  You may resume a normal diet and activities as directed by your health care provider.  Change bandages (dressings) as directed.  If you have questions or problems that seem related to general anesthesia, call the hospital and ask for the anesthetist or anesthesiologist on call. SEEK MEDICAL CARE IF:  You have nausea and vomiting that continue the day after anesthesia.  You develop a rash. SEEK IMMEDIATE MEDICAL CARE IF:   You have difficulty breathing.  You have chest pain.  You have any allergic problems.   This information is not intended to replace advice given to you by your health care provider. Make sure you discuss any questions you have with your health care provider.   Document Released: 11/28/2000 Document Revised: 09/12/2014 Document Reviewed: 12/21/2011 Elsevier Interactive Patient Education [employer]. Cystoscopy Cystoscopy is a procedure that is used to help your caregiver diagnose and sometimes treat conditions that affect your lower urinary tract. Your lower  urinary tract includes your bladder and the tube through which urine passes from your bladder out of your body (urethra). Cystoscopy is performed with a thin, tube-shaped instrument (cystoscope). The cystoscope has lenses and a light at the end so that your caregiver can see inside your bladder. The cystoscope is inserted at the entrance of your urethra. Your caregiver guides it through your urethra and into your bladder. There are two main types of cystoscopy:  Flexible cystoscopy (with a flexible cystoscope).  Rigid cystoscopy (with a rigid cystoscope). Cystoscopy may be recommended for many conditions, including:  Urinary tract infections.  Blood in your urine (hematuria).  Loss of bladder control (urinary incontinence) or overactive bladder.  Unusual cells found in a urine sample.  Urinary blockage.  Painful urination. Cystoscopy may also be done to remove a sample of your tissue to be checked under a microscope (biopsy). It may also be done to remove or destroy bladder stones. LET YOUR CAREGIVER KNOW ABOUT:  Allergies to food or medicine.  Medicines taken, including vitamins, herbs, eyedrops, over-the-counter medicines, and creams.  Use of steroids (by mouth or creams).  Previous problems with anesthetics or numbing medicines.  History of bleeding problems or blood clots.  Previous surgery.  Other health problems, including diabetes and kidney problems.  Possibility of pregnancy, if this applies. PROCEDURE The area around the opening to your urethra will be cleaned. A medicine to numb your urethra (local anesthetic) is used. If a tissue sample or stone is removed during the procedure, you may be given a medicine to make you sleep (general anesthetic). Your caregiver will gently insert the tip of the cystoscope into your  urethra. The cystoscope will be slowly glided through your urethra and into your bladder. Sterile fluid will flow through the cystoscope and into your  bladder. The fluid will expand and stretch your bladder. This gives your caregiver a better view of your bladder walls. The procedure lasts about 15-20 minutes. AFTER THE PROCEDURE If a local anesthetic is used, you will be allowed to go home as soon as you are ready. If a general anesthetic is used, you will be taken to a recovery area until you are stable. You may have temporary bleeding and burning on urination.   This information is not intended to replace advice given to you by your health care provider. Make sure you discuss any questions you have with your health care provider.   Document Released: 08/19/2000 Document Revised: 09/12/2014 Document Reviewed: 02/13/2012 Elsevier Interactive Patient Education [employer].

## 2015-08-17 NOTE — Anesthesia Preprocedure Evaluation (Signed)
Anesthesia Evaluation  Patient identified by MRN, date of birth, ID band Patient awake    Reviewed: Allergy & Precautions, H&P , NPO status , Patient's Chart, lab work & pertinent test results, reviewed documented beta blocker date and time   History of Anesthesia Complications (+) PONV and history of anesthetic complications  Airway Mallampati: II  TM Distance: >3 FB Neck ROM: full    Dental no notable dental hx. (+) Teeth Intact   Pulmonary neg pulmonary ROS, former smoker,    Pulmonary exam normal breath sounds clear to auscultation       Cardiovascular Exercise Tolerance: Good (-) hypertensionnegative cardio ROS Normal cardiovascular exam Rhythm:regular Rate:Normal     Neuro/Psych negative neurological ROS  negative psych ROS   GI/Hepatic negative GI ROS, Neg liver ROS,   Endo/Other  negative endocrine ROS  Renal/GU Renal diseasenegative Renal ROS  negative genitourinary   Musculoskeletal negative musculoskeletal ROS (+)   Abdominal   Peds negative pediatric ROS (+)  Hematology negative hematology ROS (+)   Anesthesia Other Findings   Reproductive/Obstetrics negative OB ROS                             Anesthesia Physical Anesthesia Plan  ASA: II  Anesthesia Plan: General   Post-op Pain Management:    Induction:   Airway Management Planned:   Additional Equipment:   Intra-op Plan:   Post-operative Plan:   Informed Consent: I have reviewed the patients History and Physical, chart, labs and discussed the procedure including the risks, benefits and alternatives for the proposed anesthesia with the patient or authorized representative who has indicated his/her understanding and acceptance.   Dental Advisory Given  Plan Discussed with: CRNA  Anesthesia Plan Comments:         Anesthesia Quick Evaluation

## 2015-08-17 NOTE — Op Note (Signed)
Date of procedure: 08/17/2015  Preoperative diagnosis:  1. Gross hematuria 2. Bladder lesion   Postoperative diagnosis:  1. Gross hematuria 2. Bladder lesion   Procedure: 1. Cystoscopy 2. Bladder biopsy 3 3. Fulguration of bleeders 4. Right retrograde pyelogram with interpretation  Surgeon: Baruch Gouty, MD  Anesthesia: General  Complications: None  Intraoperative findings: The posterior bladder wall was erythematous concerning for CIS. This area was biopsied 3. Right retrograde pyelogram was unremarkable with no filling defects.  EBL: None  Specimens: Bladder biopsy  Drains: None  Disposition: Stable to the postanesthesia care unit  Indication for procedure: The patient is a 49 y.o. female with gross hematuria whose workup showed bladder lesions concerning for CIS such presents for biopsy today. Also on her CT urogram, there was incomplete opacification of her distal right ureter so she is undergoing retrograde pyelogram on the right side today to further evaluate.  After reviewing the management options for treatment, the patient elected to proceed with the above surgical procedure(s). We have discussed the potential benefits and risks of the procedure, side effects of the proposed treatment, the likelihood of the patient achieving the goals of the procedure, and any potential problems that might occur during the procedure or recuperation. Informed consent has been obtained.  Description of procedure: The patient was met in the preoperative area. All risks, benefits, and indications of the procedure were described in great detail. The patient consented to the procedure. Preoperative antibiotics were given. The patient was taken to the operative theater. General anesthesia was induced per the anesthesia service. The patient was then placed in the dorsal lithotomy position and prepped and draped in the usual sterile fashion. A preoperative timeout was called. A 21 French 30  cystoscope was inserted into the patient's bladder per urethra atraumatically. Pan cystoscopy was unremarkable except for the previously seen posterior wall lesion that was erythematous concerning for CIS. This area was biopsied 3 with flexible biopsy forceps. These areas were then fulgurated. Hemostasis was excellent. A right retrograde pyelogram was also obtained due to incomplete opacification of the right ureter on a CT urogram. Right retrograde pyelogram was unremarkable with no masses or filling defects. Post drainage films are unremarkable. In particular the right distal ureter was completely normal. The patient's bladder was then drained. She was awoken from anesthesia and transferred in stable condition to the post anesthesia care unit.  [facility] will follow-up in one to 2 weeks for pathology.  Baruch Gouty, M.D.
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
## 2015-08-18 LAB — SURGICAL PATHOLOGY

## 2015-08-19 ENCOUNTER — Encounter: Payer: Self-pay | Admitting: Urology

## 2015-08-20 ENCOUNTER — Encounter: Payer: Self-pay | Admitting: Urology

## 2015-08-21 NOTE — Anesthesia Postprocedure Evaluation (Signed)
Anesthesia Post Note  Patient: Virginia [name]  Procedure(s) Performed: Procedure(s) (LRB): CYSTOSCOPY WITH BIOPSY (N/A) CYSTOSCOPY WITH RETROGRADE PYELOGRAM (Right)  Patient location during evaluation: PACU Anesthesia Type: General Level of consciousness: awake and alert Pain management: pain level controlled Vital Signs Assessment: post-procedure vital signs reviewed and stable Respiratory status: spontaneous breathing, nonlabored ventilation, respiratory function stable and patient connected to nasal cannula oxygen Cardiovascular status: blood pressure returned to baseline and stable Anesthetic complications: no    Last Vitals:  Filed Vitals:   08/17/15 1446 08/17/15 1534  BP: 115/66 102/69  Pulse: 67 69  Temp: 36.3 C 36.2 C  Resp: 14 14    Last Pain:  Filed Vitals:   08/18/15 1033  PainSc: [name]

## 2015-08-24 ENCOUNTER — Ambulatory Visit: Payer: 59

## 2015-09-14 ENCOUNTER — Encounter: Payer: Self-pay | Admitting: Urology

## 2015-09-25 ENCOUNTER — Ambulatory Visit: Payer: 59

## 2016-01-01 ENCOUNTER — Encounter (HOSPITAL_COMMUNITY): Payer: Self-pay | Admitting: Emergency Medicine

## 2016-01-01 ENCOUNTER — Emergency Department (HOSPITAL_COMMUNITY)
Admission: EM | Admit: 2016-01-01 | Discharge: 2016-01-01 | Disposition: A | Discharge status: Home / Self Care | Payer: 59 | Attending: Emergency Medicine | Admitting: Emergency Medicine

## 2016-01-01 DIAGNOSIS — Z9889 Other specified postprocedural states: Secondary | ICD-10-CM | POA: Insufficient documentation

## 2016-01-01 DIAGNOSIS — R42 Dizziness and giddiness: Secondary | ICD-10-CM | POA: Insufficient documentation

## 2016-01-01 DIAGNOSIS — Z87891 Personal history of nicotine dependence: Secondary | ICD-10-CM | POA: Diagnosis not present

## 2016-01-01 DIAGNOSIS — E860 Dehydration: Secondary | ICD-10-CM | POA: Diagnosis not present

## 2016-01-01 DIAGNOSIS — R55 Syncope and collapse: Secondary | ICD-10-CM | POA: Diagnosis not present

## 2016-01-01 DIAGNOSIS — F329 Major depressive disorder, single episode, unspecified: Secondary | ICD-10-CM | POA: Diagnosis not present

## 2016-01-01 DIAGNOSIS — Z79899 Other long term (current) drug therapy: Secondary | ICD-10-CM | POA: Diagnosis not present

## 2016-01-01 DIAGNOSIS — R197 Diarrhea, unspecified: Secondary | ICD-10-CM | POA: Diagnosis present

## 2016-01-01 DIAGNOSIS — K529 Noninfective gastroenteritis and colitis, unspecified: Secondary | ICD-10-CM | POA: Insufficient documentation

## 2016-01-01 DIAGNOSIS — R319 Hematuria, unspecified: Secondary | ICD-10-CM | POA: Insufficient documentation

## 2016-01-01 DIAGNOSIS — Z90710 Acquired absence of both cervix and uterus: Secondary | ICD-10-CM | POA: Diagnosis not present

## 2016-01-01 DIAGNOSIS — Z8781 Personal history of (healed) traumatic fracture: Secondary | ICD-10-CM | POA: Insufficient documentation

## 2016-01-01 DIAGNOSIS — Z87442 Personal history of urinary calculi: Secondary | ICD-10-CM | POA: Insufficient documentation

## 2016-01-01 LAB — URINALYSIS, ROUTINE W REFLEX MICROSCOPIC
Bilirubin Urine: NEGATIVE
Glucose, UA: NEGATIVE mg/dL
Hgb urine dipstick: NEGATIVE
KETONES UR: NEGATIVE mg/dL
LEUKOCYTES UA: NEGATIVE
NITRITE: NEGATIVE
PROTEIN: NEGATIVE mg/dL
Specific Gravity, Urine: 1.016 (ref 1.005–1.030)
pH: 8.5 — ABNORMAL HIGH (ref 5.0–8.0)

## 2016-01-01 LAB — CBC WITH DIFFERENTIAL/PLATELET
BASOS ABS: 0 10*3/uL (ref 0.0–0.1)
BASOS PCT: 0 %
EOS ABS: 0 10*3/uL (ref 0.0–0.7)
EOS PCT: 0 %
HCT: 41.7 % (ref 36.0–46.0)
Hemoglobin: 13.8 g/dL (ref 12.0–15.0)
LYMPHS PCT: 5 %
Lymphs Abs: 0.5 10*3/uL — ABNORMAL LOW (ref 0.7–4.0)
MCH: 30.5 pg (ref 26.0–34.0)
MCHC: 33.1 g/dL (ref 30.0–36.0)
MCV: 92.3 fL (ref 78.0–100.0)
Monocytes Absolute: 0.4 10*3/uL (ref 0.1–1.0)
Monocytes Relative: 4 %
Neutro Abs: 9.2 10*3/uL — ABNORMAL HIGH (ref 1.7–7.7)
Neutrophils Relative %: 91 %
PLATELETS: 205 10*3/uL (ref 150–400)
RBC: 4.52 MIL/uL (ref 3.87–5.11)
RDW: 12.3 % (ref 11.5–15.5)
WBC: 10.2 10*3/uL (ref 4.0–10.5)

## 2016-01-01 LAB — LIPASE, BLOOD: LIPASE: 32 U/L (ref 11–51)

## 2016-01-01 LAB — COMPREHENSIVE METABOLIC PANEL
ALK PHOS: 89 U/L (ref 38–126)
ALT: 17 U/L (ref 14–54)
ANION GAP: 9 (ref 5–15)
AST: 19 U/L (ref 15–41)
Albumin: 3.8 g/dL (ref 3.5–5.0)
BILIRUBIN TOTAL: 0.7 mg/dL (ref 0.3–1.2)
BUN: 15 mg/dL (ref 6–20)
CALCIUM: 9.5 mg/dL (ref 8.9–10.3)
CO2: 24 mmol/L (ref 22–32)
CREATININE: 0.73 mg/dL (ref 0.44–1.00)
Chloride: 106 mmol/L (ref 101–111)
Glucose, Bld: 111 mg/dL — ABNORMAL HIGH (ref 65–99)
Potassium: 4.2 mmol/L (ref 3.5–5.1)
Sodium: 139 mmol/L (ref 135–145)
TOTAL PROTEIN: 6.9 g/dL (ref 6.5–8.1)

## 2016-01-01 MED ORDER — SODIUM CHLORIDE 0.9 % IV BOLUS (SEPSIS)
2000.0000 mL | Freq: Once | INTRAVENOUS | Status: AC
  Administered 2016-01-01: 2000 mL via INTRAVENOUS

## 2016-01-01 MED ORDER — ONDANSETRON 4 MG PO TBDP: 4.0000 mg | ORAL_TABLET | Freq: Four times a day (QID) | ORAL | Status: DC | PRN

## 2016-01-01 NOTE — ED Notes (Signed)
From work via [employer], N/V/D at work today, found on bathroom floor - NO fall or trauma, VSS, CBG 101 4mg  Zofran
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
## 2016-01-01 NOTE — ED Notes (Signed)
Ambulated to bathroom with no difficulty and took PO fluids with no vomiting

## 2016-02-08 ENCOUNTER — Encounter: Payer: Self-pay | Admitting: Emergency Medicine

## 2016-02-08 ENCOUNTER — Emergency Department
Admission: EM | Admit: 2016-02-08 | Discharge: 2016-02-08 | Disposition: A | Discharge status: Home / Self Care | Payer: Commercial Managed Care - HMO | Attending: Emergency Medicine | Admitting: Emergency Medicine

## 2016-02-08 ENCOUNTER — Emergency Department: Payer: Commercial Managed Care - HMO

## 2016-02-08 DIAGNOSIS — Z79899 Other long term (current) drug therapy: Secondary | ICD-10-CM | POA: Insufficient documentation

## 2016-02-08 DIAGNOSIS — K625 Hemorrhage of anus and rectum: Secondary | ICD-10-CM

## 2016-02-08 DIAGNOSIS — Z87891 Personal history of nicotine dependence: Secondary | ICD-10-CM | POA: Insufficient documentation

## 2016-02-08 DIAGNOSIS — K922 Gastrointestinal hemorrhage, unspecified: Secondary | ICD-10-CM | POA: Insufficient documentation

## 2016-02-08 DIAGNOSIS — F329 Major depressive disorder, single episode, unspecified: Secondary | ICD-10-CM | POA: Diagnosis not present

## 2016-02-08 DIAGNOSIS — R1030 Lower abdominal pain, unspecified: Secondary | ICD-10-CM | POA: Insufficient documentation

## 2016-02-08 LAB — COMPREHENSIVE METABOLIC PANEL
ALT: 17 U/L (ref 14–54)
AST: 22 U/L (ref 15–41)
Albumin: 4.3 g/dL (ref 3.5–5.0)
Alkaline Phosphatase: 88 U/L (ref 38–126)
Anion gap: 8 (ref 5–15)
BILIRUBIN TOTAL: 0.6 mg/dL (ref 0.3–1.2)
BUN: 18 mg/dL (ref 6–20)
CALCIUM: 9.6 mg/dL (ref 8.9–10.3)
CO2: 26 mmol/L (ref 22–32)
CREATININE: 0.82 mg/dL (ref 0.44–1.00)
Chloride: 104 mmol/L (ref 101–111)
GFR calc Af Amer: >60 mL/min (ref >60)
Glucose, Bld: 105 mg/dL — ABNORMAL HIGH (ref 65–99)
Potassium: 3.3 mmol/L — ABNORMAL LOW (ref 3.5–5.1)
Sodium: 138 mmol/L (ref 135–145)
TOTAL PROTEIN: 7.3 g/dL (ref 6.5–8.1)

## 2016-02-08 LAB — CBC
HCT: 39.6 % (ref 35.0–47.0)
Hemoglobin: 13.6 g/dL (ref 12.0–16.0)
MCH: 31 pg (ref 26.0–34.0)
MCHC: 34.4 g/dL (ref 32.0–36.0)
MCV: 90.1 fL (ref 80.0–100.0)
Platelets: 200 10*3/uL (ref 150–440)
RBC: 4.39 MIL/uL (ref 3.80–5.20)
RDW: 12.6 % (ref 11.5–14.5)
WBC: 7.4 10*3/uL (ref 3.6–11.0)

## 2016-02-08 MED ORDER — METRONIDAZOLE 500 MG PO TABS: 500.0000 mg | ORAL_TABLET | Freq: Three times a day (TID) | ORAL | Status: AC

## 2016-02-08 MED ORDER — IOPAMIDOL (ISOVUE-300) INJECTION 61%
100.0000 mL | Freq: Once | INTRAVENOUS | Status: AC | PRN
  Administered 2016-02-08: 100 mL via INTRAVENOUS
  Filled 2016-02-08: qty 100

## 2016-02-08 MED ORDER — DIATRIZOATE MEGLUMINE & SODIUM 66-10 % PO SOLN
15.0000 mL | Freq: Once | ORAL | Status: AC
  Administered 2016-02-08: 15 mL via ORAL

## 2016-02-08 MED ORDER — CIPROFLOXACIN HCL 500 MG PO TABS: 500.0000 mg | ORAL_TABLET | Freq: Two times a day (BID) | ORAL | Status: AC
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
## 2016-02-08 NOTE — ED Notes (Signed)
Dark rectal bleeding yesterday and today. Abdominal pain today.

## 2016-02-08 NOTE — Discharge Instructions (Signed)

## 2016-02-08 NOTE — ED Notes (Signed)
This RN assisted with rectal exam with MD.

## 2016-02-10 ENCOUNTER — Telehealth: Payer: Self-pay | Admitting: Internal Medicine

## 2016-02-10 NOTE — Telephone Encounter (Signed)
Patient will come in and see Dr. Carlean Purl on 02/15/16 3:15

## 2016-02-10 NOTE — Telephone Encounter (Signed)
Left message for patient to call back  

## 2016-02-10 NOTE — Telephone Encounter (Signed)
Needs appt me or PA and then me for procedure re: rectal bleeding  Please contact her and arrange  She works at [employer] and Dr. Johnnye Sima asked me to see her  I am willing to see her 6/12

## 2016-02-15 ENCOUNTER — Ambulatory Visit (INDEPENDENT_AMBULATORY_CARE_PROVIDER_SITE_OTHER): Payer: 59 | Admitting: Internal Medicine

## 2016-02-15 ENCOUNTER — Encounter: Payer: Self-pay | Admitting: Internal Medicine

## 2016-02-15 VITALS — BP 102/70 | HR 80 | Ht 68.0 in | Wt 172.0 lb

## 2016-02-15 DIAGNOSIS — K529 Noninfective gastroenteritis and colitis, unspecified: Secondary | ICD-10-CM

## 2016-02-15 DIAGNOSIS — R110 Nausea: Secondary | ICD-10-CM

## 2016-02-15 DIAGNOSIS — K625 Hemorrhage of anus and rectum: Secondary | ICD-10-CM

## 2016-02-15 NOTE — Progress Notes (Signed)
   Subjective:    Patient ID: Virginia [name], female    DOB: [birth year], 50 y.o.   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 Then in the past several months perhaps longer she's developed diarrhea problems which have intensified. Frequent urgent watery stools with incontinence. Last week she had rectal bleeding. She went to the ER at [facility] where a CT scan was unrevealing as were her labs but she was placed on empiric Cipro and metronidazole. Quite nauseous with the metronidazole. Perhaps a little better but not really a much change. The bleeding was self-limited it was dark red she says. There is no pain in the rectum her known hemorrhoids. She's never had a colonoscopy or at a GI visit. She spent several years working in the [place] and other parts of [place] and has been home from there is since 2008, but says her stomach is never been right since then. No recent antibiotics or untreated water or sick contacts. Not really using any over-the-counter medications at this time. There is rare sleep disturbance related to the diarrhea.  Medications, allergies, past medical history, past surgical history, family history and social history are reviewed and updated in the EMR.    Review of Systems As above. Also positive for some insomnia menstrual pain night sweats back pain and she's had intermittent hematuria of unclear reason. She does think she's been dehydrated and lightheaded at times and she also thinks she may see hematuria when she gets dehydrated with the diarrhea. His systems are negative or as in the history of present illness.    Objective:   Physical Exam @BP  102/70 mmHg  Pulse 80  Ht 5\' 8"  (1.727 m)  Wt 172 lb (78.019 kg)  BMI 26.16 kg/m2@  General:  Well-developed,  well-nourished and in no acute distress Eyes:  anicteric. Neck:   supple w/o thyromegaly or mass.  Lungs: Clear to auscultation bilaterally. Heart:  S1S2, no rubs, murmurs, gallops. Abdomen:  soft, non-tender, no hepatosplenomegaly, hernia, or mass and BS+.  Rectal: Deferred until colonoscopy Lymph:  no cervical or supraclavicular adenopathy. Extremities:   no edema, cyanosis or clubbing Skin   no rash. There is a small tattoo on the lower back Neuro:  A&O x 3.  Psych:  appropriate mood and  Affect.   Data Reviewed: ER visit from June 5 CT scan as well. Labs from that date. Notable abnormality was a potassium of 3.3. Mild hyperglycemia glucose 105.      Assessment & Plan:   1. Chronic diarrhea   2. Rectal bleeding   3. Chronic nausea    These chronic symptoms it intensified raise the question of inflammatory bowel disease whether macroscopic such as Crohn's or ulcerative colitis or microscopic with rectal bleeding from anal irritation. We have scheduled that colonoscopy to evaluate.The risks and benefits as well as alternatives of endoscopic procedure(s) have been discussed and reviewed. All questions answered. The patient agrees to proceed. Further plans pending that result. Would anticipate random colon biopsies and terminal ileal intubation if possible.

## 2016-02-15 NOTE — Patient Instructions (Signed)
You have been scheduled for a colonoscopy. Please follow written instructions given to you at your visit today.  Please pick up your prep supplies at the pharmacy. If you use inhalers (even only as needed), please bring them with you on the day of your procedure.   I appreciate the opportunity to care for you. Carl Gessner, MD, FACG 

## 2016-02-17 ENCOUNTER — Ambulatory Visit (AMBULATORY_SURGERY_CENTER): Payer: 59 | Admitting: Internal Medicine

## 2016-02-17 ENCOUNTER — Encounter: Payer: Self-pay | Admitting: Internal Medicine

## 2016-02-17 VITALS — BP 101/69 | HR 71 | Temp 98.0°F | Resp 10 | Ht 68.0 in | Wt 172.0 lb

## 2016-02-17 DIAGNOSIS — R197 Diarrhea, unspecified: Secondary | ICD-10-CM | POA: Diagnosis present

## 2016-02-17 MED ORDER — SODIUM CHLORIDE 0.9 % IV SOLN: 500.0000 mL | INTRAVENOUS | Status: DC

## 2016-02-17 NOTE — Op Note (Signed)
[facility] Patient Name: Virginia [name] Procedure Date: 02/17/2016 7:08 AM MRN: [id number] Endoscopist: Gatha Mayer , MD Age: 50 Referring MD:  Date of Birth: 1965/12/22 Gender: Female Procedure:                Colonoscopy Indications:              Clinically significant diarrhea of unexplained                            origin Medicines:                Propofol per Anesthesia, Monitored Anesthesia Care Procedure:                Pre-Anesthesia Assessment:                           - Prior to the procedure, a History and Physical                            was performed, and patient medications and                            allergies were reviewed. The patient's tolerance of                            previous anesthesia was also reviewed. The risks                            and benefits of the procedure and the sedation                            options and risks were discussed with the patient.                            All questions were answered, and informed consent                            was obtained. Prior Anticoagulants: The patient has                            taken no previous anticoagulant or antiplatelet                            agents. ASA Grade Assessment: II - A patient with                            mild systemic disease. After reviewing the risks                            and benefits, the patient was deemed in                            satisfactory condition to undergo the procedure.  After obtaining informed consent, the colonoscope                            was passed under direct vision. Throughout the                            procedure, the patient's blood pressure, pulse, and                            oxygen saturations were monitored continuously. The                            Model CF-HQ190L [id number]) scope was introduced                            through the anus and advanced to the the terminal                          ileum, with identification of the appendiceal                            orifice and IC valve. The colonoscopy was performed                            without difficulty. The patient tolerated the                            procedure well. The quality of the bowel                            preparation was excellent. The bowel preparation                            used was Miralax. The terminal ileum, ileocecal                            valve, appendiceal orifice, and rectum were                            photographed. Scope In: 7:30:10 AM Scope Out: 7:41:03 AM Scope Withdrawal Time: 0 hours 8 minutes 26 seconds  Total Procedure Duration: 0 hours 10 minutes 53 seconds  Findings:                 The perianal and digital rectal examinations were                            normal.                           The terminal ileum appeared normal.                           A patchy area of mildly vascular-pattern-decreased  mucosa was found in the entire colon. Biopsies for                            histology were taken with a cold forceps from the                            ascending colon, transverse colon, descending                            colon, sigmoid colon and rectum for evaluation of                            microscopic colitis. Verification of patient                            identification for the specimen was done. Estimated                            blood loss was minimal.                           Internal hemorrhoids were found during retroflexion.                           The exam was otherwise without abnormality on                            direct and retroflexion views. Complications:            No immediate complications. Estimated Blood Loss:     Estimated blood loss was minimal. Impression:               - The examined portion of the ileum was normal.                           - Vascular-pattern-decreased mucosa  in the entire                            examined colon. Biopsied.                           - Internal hemorrhoids.                           - The examination was otherwise normal on direct                            and retroflexion views. Recommendation:           - Patient has a contact number available for                            emergencies. The signs and symptoms of potential                            delayed complications were discussed with the  patient. Return to normal activities tomorrow.                            Written discharge instructions were provided to the                            patient.                           - Resume previous diet.                           - Continue present medications.                           - Await pathology results.                           - Repeat colonoscopy in 10 years for screening                            purposes. Gatha Mayer, MD 02/17/2016 7:54:53 AM This report has been signed electronically.

## 2016-02-17 NOTE — Progress Notes (Signed)
Stable to RR 

## 2016-02-17 NOTE — Progress Notes (Signed)
Called to room to assist during endoscopic procedure.  Patient ID and intended procedure confirmed with present staff. Received instructions for my participation in the procedure from the performing physician.  

## 2016-02-17 NOTE — Patient Instructions (Addendum)
Overall looks normal - just some subtle changes - non-specific.  I took biopsies and sent them to be read quickly.  You do have internal hemorrhoids - source of bleeding  Will call in 1-2 days  I appreciate the opportunity to care for you. Gatha Mayer, MD, FACG  YOU HAD AN ENDOSCOPIC PROCEDURE TODAY AT [facility]:   Refer to the procedure report that was given to you for any specific questions about what was found during the examination.  If the procedure report does not answer your questions, please call your gastroenterologist to clarify.  If you requested that your care partner not be given the details of your procedure findings, then the procedure report has been included in a sealed envelope for you to review at your convenience later.  YOU SHOULD EXPECT: Some feelings of bloating in the abdomen. Passage of more gas than usual.  Walking can help get rid of the air that was put into your GI tract during the procedure and reduce the bloating. If you had a lower endoscopy (such as a colonoscopy or flexible sigmoidoscopy) you may notice spotting of blood in your stool or on the toilet paper. If you underwent a bowel prep for your procedure, you may not have a normal bowel movement for a few days.  Please Note:  You might notice some irritation and congestion in your nose or some drainage.  This is from the oxygen used during your procedure.  There is no need for concern and it should clear up in a day or so.  SYMPTOMS TO REPORT IMMEDIATELY:   Following lower endoscopy (colonoscopy or flexible sigmoidoscopy):  Excessive amounts of blood in the stool  Significant tenderness or worsening of abdominal pains  Swelling of the abdomen that is new, acute  Fever of 100F or higher   Following upper endoscopy (EGD)  Vomiting of blood or coffee ground material  New chest pain or pain under the shoulder blades  Painful or persistently difficult swallowing  New  shortness of breath  Fever of 100F or higher  Black, tarry-looking stools  For urgent or emergent issues, a gastroenterologist can be reached at any hour by calling [phone].   DIET: Your first meal following the procedure should be a small meal and then it is ok to progress to your normal diet. Heavy or fried foods are harder to digest and may make you feel nauseous or bloated.  Likewise, meals heavy in dairy and vegetables can increase bloating.  Drink plenty of fluids but you should avoid alcoholic beverages for 24 hours.  ACTIVITY:  You should plan to take it easy for the rest of today and you should NOT DRIVE or use heavy machinery until tomorrow (because of the sedation medicines used during the test).    FOLLOW UP: Our staff will call the number listed on your records the next business day following your procedure to check on you and address any questions or concerns that you may have regarding the information given to you following your procedure. If we do not reach you, we will leave a message.  However, if you are feeling well and you are not experiencing any problems, there is no need to return our call.  We will assume that you have returned to your regular daily activities without incident.  If any biopsies were taken you will be contacted by phone or by letter within the next 1-3 weeks.  Please call us at [phone]  [phone] if you have not heard about the biopsies in 3 weeks.    SIGNATURES/CONFIDENTIALITY: You and/or your care partner have signed paperwork which will be entered into your electronic medical record.  These signatures attest to the fact that that the information above on your After Visit Summary has been reviewed and is understood.  Full responsibility of the confidentiality of this discharge information lies with you and/or your care-partner.  Hemorrhoid information given.

## 2016-02-18 ENCOUNTER — Telehealth: Payer: Self-pay

## 2016-02-18 NOTE — Telephone Encounter (Signed)
  Follow up Call-  Call back number 02/17/2016  Post procedure Call Back phone  # [phone]  Permission to leave phone message Yes    Patient was called for follow up after her procedure on 02/17/2016. No answer at the number given for follow up phone call. A message was left on the answering machine.

## 2016-02-19 ENCOUNTER — Other Ambulatory Visit: Payer: Self-pay | Admitting: Internal Medicine

## 2016-02-19 NOTE — Progress Notes (Signed)
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

## 2016-02-19 NOTE — Progress Notes (Signed)
Quick Note:  Will call from ofc No letter Recall 2027 colon ______

## 2016-02-22 ENCOUNTER — Other Ambulatory Visit: Payer: Self-pay

## 2016-02-22 MED ORDER — DIPHENOXYLATE-ATROPINE 2.5-0.025 MG PO TABS: ORAL_TABLET | ORAL | Status: AC

## 2016-02-22 MED ORDER — RIFAXIMIN 550 MG PO TABS: 550.0000 mg | ORAL_TABLET | Freq: Three times a day (TID) | ORAL | Status: DC

## 2018-01-13 IMAGING — CT CT ABD-PELV W/ CM
2 of 5 series · 16 of 46 positions shown, 18 images · IV contrast (iopamidol)
Comparison: CT scan 08/05/2015.

CLINICAL DATA: Abdominal pain and bloody diarrhea this morning.

EXAM:
CT ABDOMEN AND PELVIS WITH CONTRAST
TECHNIQUE: Multidetector CT imaging of the abdomen and pelvis was performed
using the standard protocol following bolus administration of
intravenous contrast.
CONTRAST:  100mL RUCTKA-ISS IOPAMIDOL (RUCTKA-ISS) INJECTION 61%

[Series 2: routine abd pel with · axial · 0.68mm/px · z∈[-1094,-678]mm · 13 of 93 slices shown, 15 images]
[im 5/93  soft-tissue]
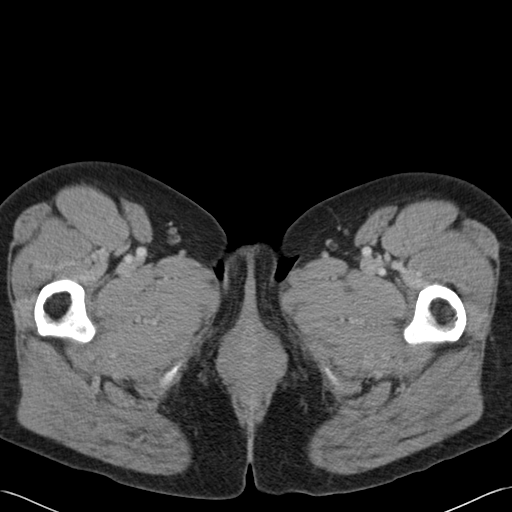
[im 5/93  bone]
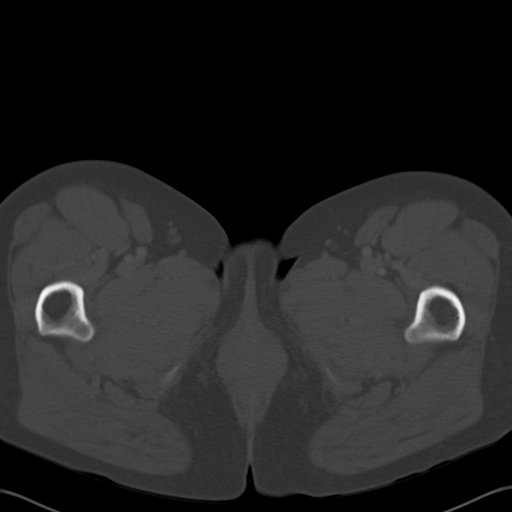
[im 15/93  soft-tissue]
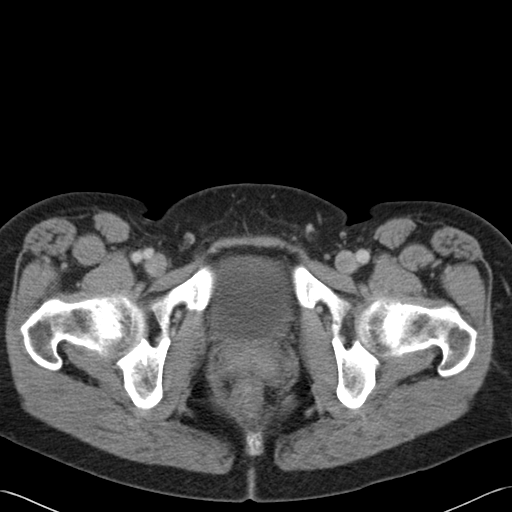
[im 20/93  soft-tissue]
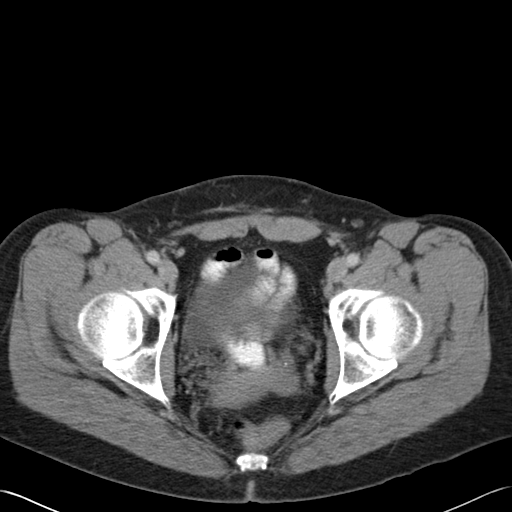
[im 25/93  soft-tissue]
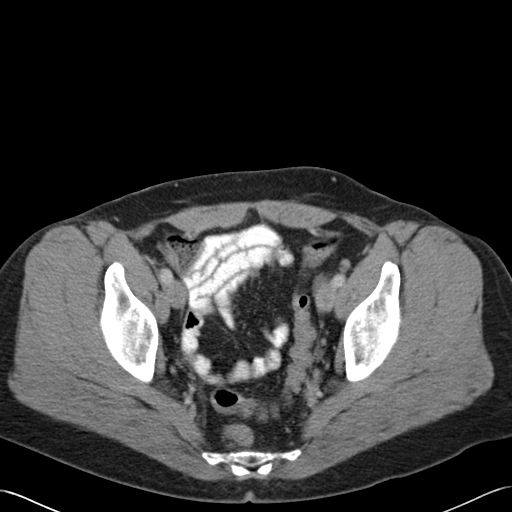
[im 34/93  soft-tissue]
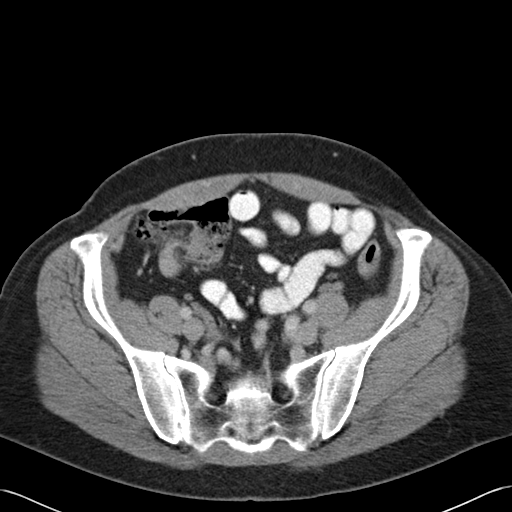
[im 39/93  soft-tissue]
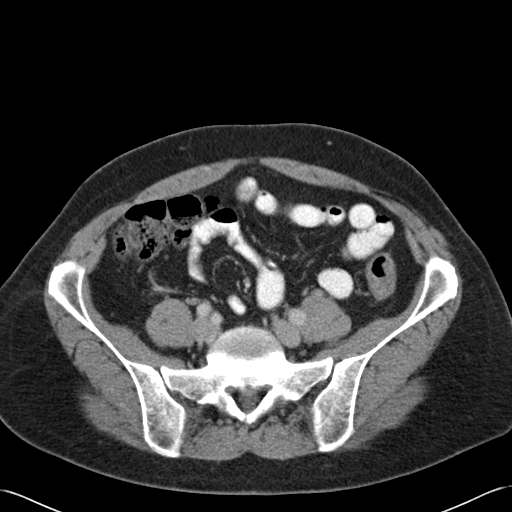
[im 49/93  soft-tissue]
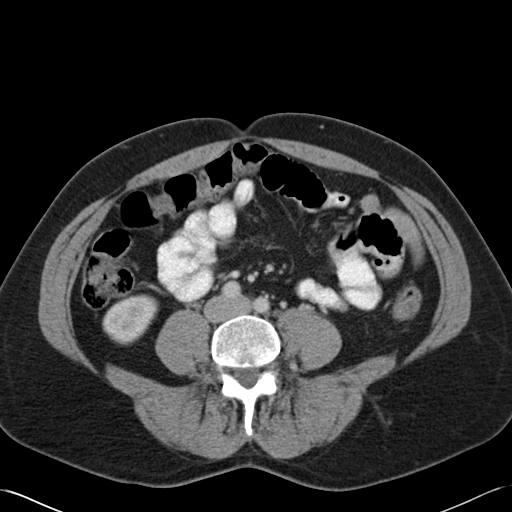
[im 54/93  soft-tissue]
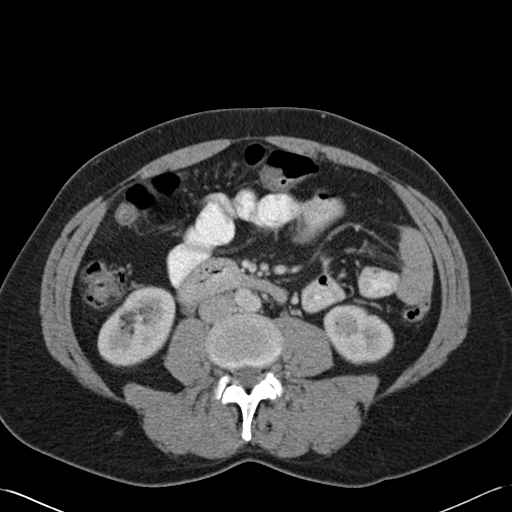
[im 59/93  soft-tissue]
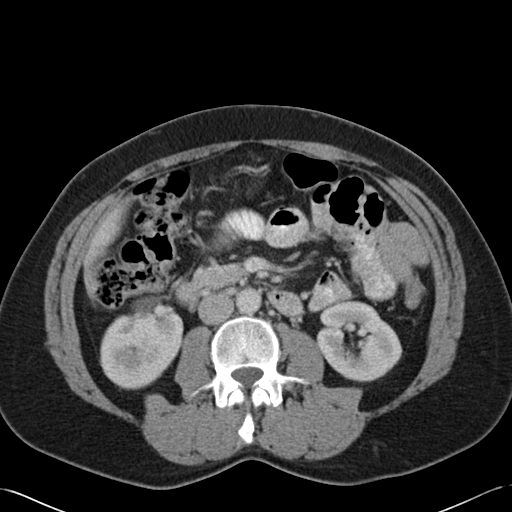
[im 59/93  bone]
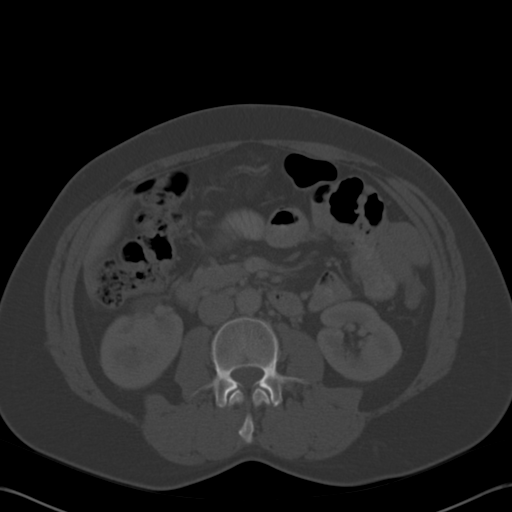
[im 68/93  soft-tissue]
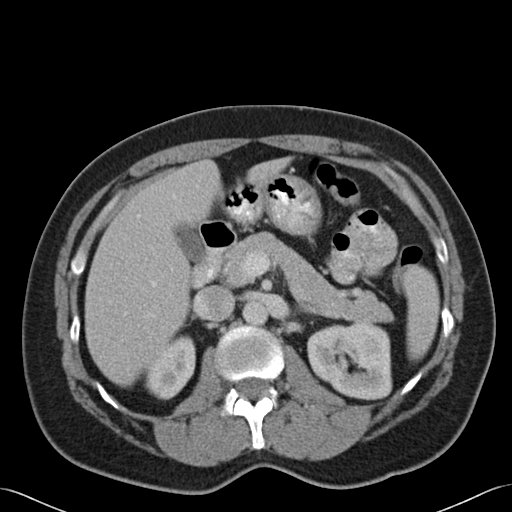
[im 73/93  soft-tissue]
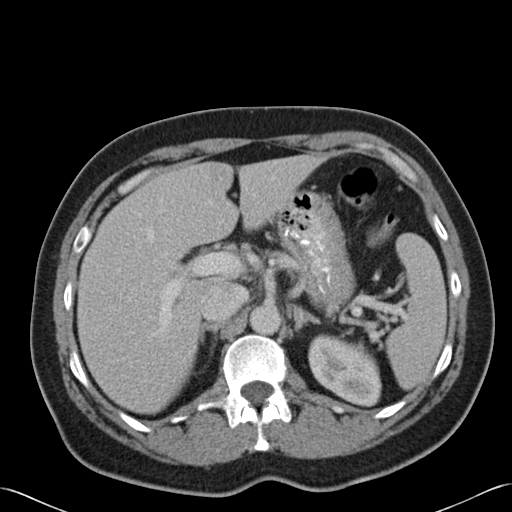
[im 78/93  soft-tissue]
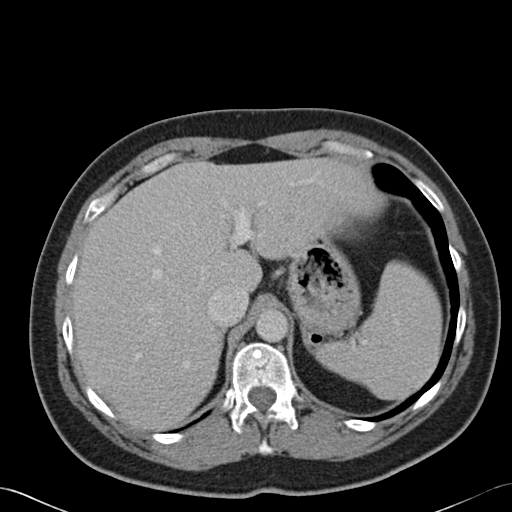
[im 88/93  soft-tissue]
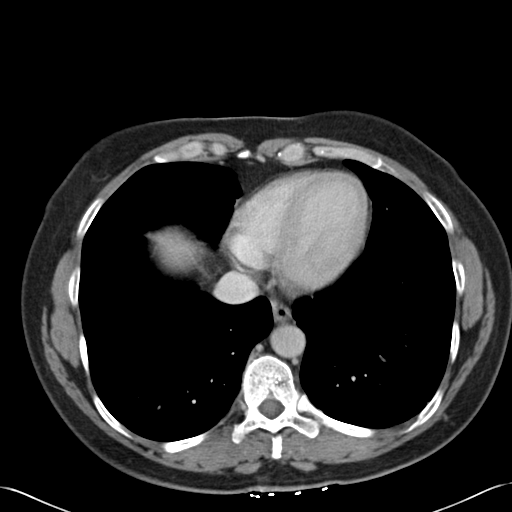

[Series 5: cor routine abd pel with · coronal · 0.67mm/px · 3 of 134 slices shown]
[im 45/134  soft-tissue]
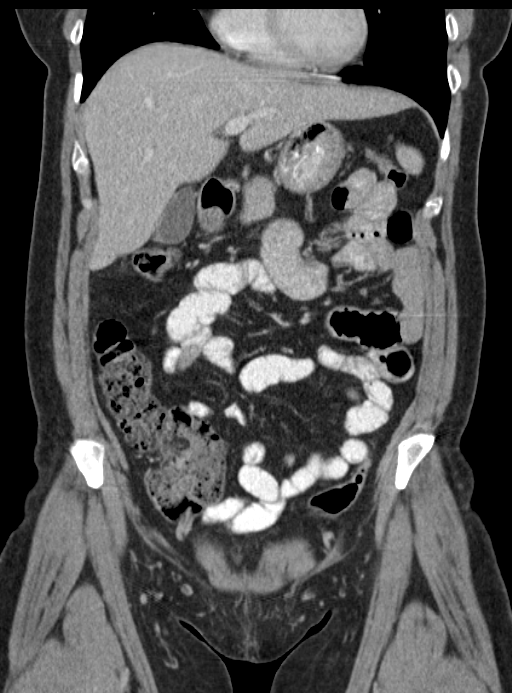
[im 60/134  soft-tissue]
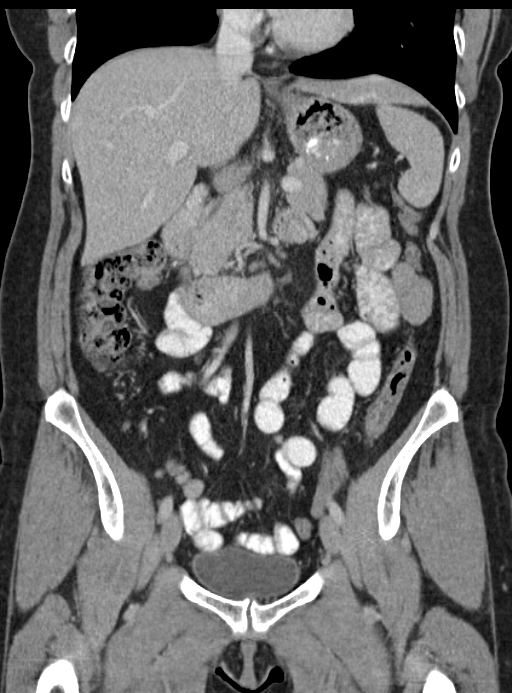
[im 74/134  soft-tissue]
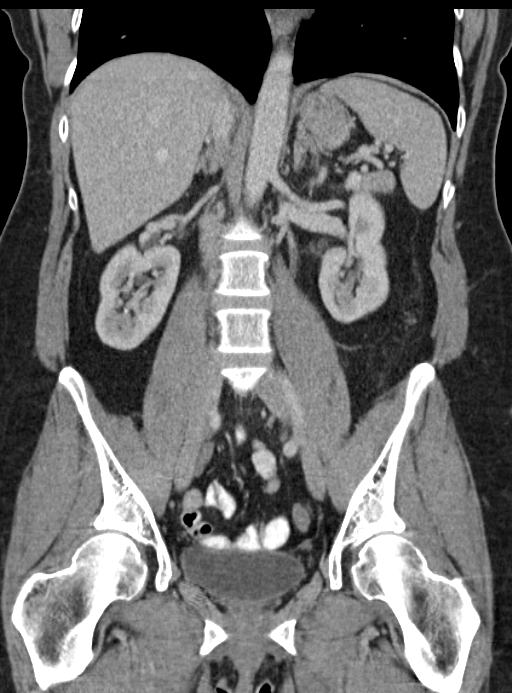

[16 of 46 positions shown; findings below may reference images not displayed]

FINDINGS: Lower chest: The lung bases are clear of acute process. No pleural
effusion or pulmonary lesions. The heart is normal in size. No
pericardial effusion. The distal esophagus and aorta are
unremarkable.

Hepatobiliary: Stable small hepatic cysts. No worrisome hepatic
lesions or intrahepatic biliary dilatation. The portal and hepatic
veins are patent. Possible noncalcified gallstones. No common bile
duct dilatation.

Pancreas: No mass, inflammation or ductal dilatation.

Spleen: Normal size.  No focal lesions.

Adrenals/Urinary Tract: The adrenal glands and kidneys are normal
except for a lower pole left renal calculus. No obstructing ureteral
calculi or bladder calculi.

Stomach/Bowel: The stomach, the duodenum, small bowel and colon are
unremarkable. No inflammatory changes, mass lesions or obstructive
findings. The terminal ileum is normal. The appendix is normal.

Vascular/Lymphatic: No mesenteric or retroperitoneal mass or
adenopathy. The aorta and branch vessels are patent. The major
venous structures are patent.

Reproductive: The uterus is surgically absent. The left ovary
appears normal. I do not see the right ovary for certain.

Other: Fell pelvic mass or adenopathy 50 foot fluid left fifth fifth
valve healed soft diffuse

Musculoskeletal: No significant bony findings.
IMPRESSION: 1. No acute abdominal/pelvic findings, mass lesions or
lymphadenopathy.
2. Possible noncalcified gallstones.
3. Lower pole right renal calculus. No obstructing ureteral calculi.

## 2019-06-19 ENCOUNTER — Other Ambulatory Visit: Payer: Self-pay

## 2019-06-19 DIAGNOSIS — Z20822 Contact with and (suspected) exposure to covid-19: Secondary | ICD-10-CM

## 2019-06-20 LAB — NOVEL CORONAVIRUS, NAA: SARS-CoV-2, NAA: NOT DETECTED

## 2021-08-30 ENCOUNTER — Ambulatory Visit (HOSPITAL_BASED_OUTPATIENT_CLINIC_OR_DEPARTMENT_OTHER)
Admission: EM | Admit: 2021-08-30 | Discharge: 2021-08-31 | Disposition: A | Discharge status: Home / Self Care | Payer: Self-pay | Attending: Emergency Medicine | Admitting: Emergency Medicine

## 2021-08-30 ENCOUNTER — Encounter (HOSPITAL_BASED_OUTPATIENT_CLINIC_OR_DEPARTMENT_OTHER): Payer: Self-pay | Admitting: Urology

## 2021-08-30 ENCOUNTER — Emergency Department (HOSPITAL_BASED_OUTPATIENT_CLINIC_OR_DEPARTMENT_OTHER): Payer: Self-pay

## 2021-08-30 DIAGNOSIS — Z20822 Contact with and (suspected) exposure to covid-19: Secondary | ICD-10-CM | POA: Insufficient documentation

## 2021-08-30 DIAGNOSIS — Z87891 Personal history of nicotine dependence: Secondary | ICD-10-CM | POA: Insufficient documentation

## 2021-08-30 DIAGNOSIS — N132 Hydronephrosis with renal and ureteral calculous obstruction: Secondary | ICD-10-CM | POA: Insufficient documentation

## 2021-08-30 DIAGNOSIS — N201 Calculus of ureter: Secondary | ICD-10-CM

## 2021-08-30 LAB — CBC WITH DIFFERENTIAL/PLATELET
Abs Immature Granulocytes: 0.12 10*3/uL — ABNORMAL HIGH (ref 0.00–0.07)
Basophils Absolute: 0.1 10*3/uL (ref 0.0–0.1)
Basophils Relative: 1 %
Eosinophils Absolute: 0.1 10*3/uL (ref 0.0–0.5)
Eosinophils Relative: 2 %
HCT: 38.5 % (ref 36.0–46.0)
Hemoglobin: 13.5 g/dL (ref 12.0–15.0)
Immature Granulocytes: 2 %
Lymphocytes Relative: 26 %
Lymphs Abs: 2.1 10*3/uL (ref 0.7–4.0)
MCH: 31.1 pg (ref 26.0–34.0)
MCHC: 35.1 g/dL (ref 30.0–36.0)
MCV: 88.7 fL (ref 80.0–100.0)
Monocytes Absolute: 0.4 10*3/uL (ref 0.1–1.0)
Monocytes Relative: 5 %
Neutro Abs: 5.2 10*3/uL (ref 1.7–7.7)
Neutrophils Relative %: 64 %
Platelets: 262 10*3/uL (ref 150–400)
RBC: 4.34 MIL/uL (ref 3.87–5.11)
RDW: 11.9 % (ref 11.5–15.5)
WBC: 8 10*3/uL (ref 4.0–10.5)
nRBC: 0 % (ref 0.0–0.2)

## 2021-08-30 MED ORDER — HYDROMORPHONE HCL 1 MG/ML IJ SOLN
1.0000 mg | Freq: Once | INTRAMUSCULAR | Status: AC
  Administered 2021-08-30: 1 mg via INTRAVENOUS
  Filled 2021-08-30: qty 1

## 2021-08-30 MED ORDER — ONDANSETRON HCL 4 MG/2ML IJ SOLN
4.0000 mg | Freq: Once | INTRAMUSCULAR | Status: AC
  Administered 2021-08-30: 4 mg via INTRAVENOUS
  Filled 2021-08-30: qty 2

## 2021-08-30 NOTE — ED Triage Notes (Signed)
Left sided flank pain x 2 hours N/V noted 20 G IV in LAC by EMS, 4 mg Zofran given in route C/o increased urination over past 2 days.

## 2021-08-30 NOTE — ED Notes (Signed)
ED Provider at bedside. 
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
## 2021-08-31 ENCOUNTER — Emergency Department (HOSPITAL_COMMUNITY): Payer: Self-pay

## 2021-08-31 ENCOUNTER — Emergency Department (HOSPITAL_COMMUNITY): Payer: Self-pay | Admitting: Registered Nurse

## 2021-08-31 ENCOUNTER — Encounter (HOSPITAL_BASED_OUTPATIENT_CLINIC_OR_DEPARTMENT_OTHER): Payer: Self-pay | Admitting: Registered Nurse

## 2021-08-31 ENCOUNTER — Encounter (HOSPITAL_COMMUNITY)
Admission: EM | Disposition: A | Discharge status: Home / Self Care | Payer: Self-pay | Source: Home / Self Care | Attending: Emergency Medicine

## 2021-08-31 HISTORY — PX: CYSTOSCOPY WITH URETEROSCOPY AND STENT PLACEMENT: SHX6377

## 2021-08-31 LAB — URINALYSIS, ROUTINE W REFLEX MICROSCOPIC
Bilirubin Urine: NEGATIVE
Glucose, UA: NEGATIVE mg/dL
Ketones, ur: NEGATIVE mg/dL
Nitrite: NEGATIVE
Protein, ur: NEGATIVE mg/dL
Specific Gravity, Urine: >=1.03 (ref 1.005–1.030)
pH: 3 — ABNORMAL LOW (ref 5.0–8.0)

## 2021-08-31 LAB — COMPREHENSIVE METABOLIC PANEL
ALT: 16 U/L (ref 0–44)
AST: 21 U/L (ref 15–41)
Albumin: 4 g/dL (ref 3.5–5.0)
Alkaline Phosphatase: 76 U/L (ref 38–126)
Anion gap: 10 (ref 5–15)
BUN: 18 mg/dL (ref 6–20)
CO2: 22 mmol/L (ref 22–32)
Calcium: 9.2 mg/dL (ref 8.9–10.3)
Chloride: 102 mmol/L (ref 98–111)
Creatinine, Ser: 0.88 mg/dL (ref 0.44–1.00)
GFR, Estimated: >60 mL/min (ref >60)
Glucose, Bld: 177 mg/dL — ABNORMAL HIGH (ref 70–99)
Potassium: 3 mmol/L — ABNORMAL LOW (ref 3.5–5.1)
Sodium: 134 mmol/L — ABNORMAL LOW (ref 135–145)
Total Bilirubin: 0.3 mg/dL (ref 0.3–1.2)
Total Protein: 7.4 g/dL (ref 6.5–8.1)

## 2021-08-31 LAB — RESP PANEL BY RT-PCR (FLU A&B, COVID) ARPGX2
Influenza A by PCR: NEGATIVE
Influenza B by PCR: NEGATIVE
SARS Coronavirus 2 by RT PCR: NEGATIVE

## 2021-08-31 LAB — URINALYSIS, MICROSCOPIC (REFLEX)

## 2021-08-31 SURGERY — CYSTOURETEROSCOPY, WITH STENT INSERTION
Anesthesia: General | Site: Ureter | Laterality: Bilateral

## 2021-08-31 MED ORDER — ONDANSETRON HCL 4 MG/2ML IJ SOLN
INTRAMUSCULAR | Status: DC | PRN
  Administered 2021-08-31: 4 mg via INTRAVENOUS

## 2021-08-31 MED ORDER — FENTANYL CITRATE PF 50 MCG/ML IJ SOSY: 25.0000 ug | PREFILLED_SYRINGE | INTRAMUSCULAR | Status: DC | PRN

## 2021-08-31 MED ORDER — PROPOFOL 10 MG/ML IV BOLUS
INTRAVENOUS | Status: AC
  Filled 2021-08-31: qty 20

## 2021-08-31 MED ORDER — ACETAMINOPHEN 10 MG/ML IV SOLN: 1000.0000 mg | Freq: Once | INTRAVENOUS | Status: DC | PRN

## 2021-08-31 MED ORDER — POTASSIUM CHLORIDE 10 MEQ/100ML IV SOLN
10.0000 meq | INTRAVENOUS | Status: AC
  Administered 2021-08-31 (×2): 10 meq via INTRAVENOUS
  Filled 2021-08-31 (×2): qty 100

## 2021-08-31 MED ORDER — FENTANYL CITRATE (PF) 100 MCG/2ML IJ SOLN
INTRAMUSCULAR | Status: DC | PRN
  Administered 2021-08-31 (×2): 50 ug via INTRAVENOUS
  Administered 2021-08-31: 100 ug via INTRAVENOUS

## 2021-08-31 MED ORDER — AMISULPRIDE (ANTIEMETIC) 5 MG/2ML IV SOLN: 10.0000 mg | Freq: Once | INTRAVENOUS | Status: DC | PRN

## 2021-08-31 MED ORDER — SCOPOLAMINE 1 MG/3DAYS TD PT72
MEDICATED_PATCH | TRANSDERMAL | Status: DC | PRN
  Administered 2021-08-31: 1 via TRANSDERMAL

## 2021-08-31 MED ORDER — MIDAZOLAM HCL 2 MG/2ML IJ SOLN
INTRAMUSCULAR | Status: AC
  Filled 2021-08-31: qty 2

## 2021-08-31 MED ORDER — HYDROCODONE-ACETAMINOPHEN 5-325 MG PO TABS: 1.0000 | ORAL_TABLET | Freq: Four times a day (QID) | ORAL | 0 refills | Status: DC | PRN

## 2021-08-31 MED ORDER — PHENYLEPHRINE HCL (PRESSORS) 10 MG/ML IV SOLN
INTRAVENOUS | Status: AC
  Filled 2021-08-31: qty 1

## 2021-08-31 MED ORDER — SODIUM CHLORIDE 0.9 % IV SOLN: INTRAVENOUS | Status: DC | PRN

## 2021-08-31 MED ORDER — SCOPOLAMINE 1 MG/3DAYS TD PT72
MEDICATED_PATCH | TRANSDERMAL | Status: AC
  Filled 2021-08-31: qty 1

## 2021-08-31 MED ORDER — 0.9 % SODIUM CHLORIDE (POUR BTL) OPTIME
TOPICAL | Status: DC | PRN
  Administered 2021-08-31: 03:00:00 1000 mL

## 2021-08-31 MED ORDER — ACETAMINOPHEN 160 MG/5ML PO SOLN: 325.0000 mg | ORAL | Status: DC | PRN

## 2021-08-31 MED ORDER — EPHEDRINE SULFATE 50 MG/ML IJ SOLN
INTRAMUSCULAR | Status: DC | PRN
  Administered 2021-08-31: 5 mg via INTRAVENOUS

## 2021-08-31 MED ORDER — CEFAZOLIN SODIUM-DEXTROSE 2-4 GM/100ML-% IV SOLN
2.0000 g | Freq: Once | INTRAVENOUS | Status: AC
  Administered 2021-08-31: 03:00:00 2 g via INTRAVENOUS

## 2021-08-31 MED ORDER — DEXAMETHASONE SODIUM PHOSPHATE 10 MG/ML IJ SOLN
INTRAMUSCULAR | Status: DC | PRN
Start: 2021-08-31 — End: 2021-08-31
  Administered 2021-08-31: 8 mg via INTRAVENOUS

## 2021-08-31 MED ORDER — MIDAZOLAM HCL 5 MG/5ML IJ SOLN
INTRAMUSCULAR | Status: DC | PRN
  Administered 2021-08-31: 1 mg via INTRAVENOUS

## 2021-08-31 MED ORDER — ACETAMINOPHEN 10 MG/ML IV SOLN
INTRAVENOUS | Status: DC | PRN
  Administered 2021-08-31: 1000 mg via INTRAVENOUS

## 2021-08-31 MED ORDER — ONDANSETRON HCL 4 MG/2ML IJ SOLN
INTRAMUSCULAR | Status: AC
  Filled 2021-08-31: qty 2

## 2021-08-31 MED ORDER — ACETAMINOPHEN 10 MG/ML IV SOLN
INTRAVENOUS | Status: AC
  Filled 2021-08-31: qty 100

## 2021-08-31 MED ORDER — ACETAMINOPHEN 325 MG PO TABS: 325.0000 mg | ORAL_TABLET | ORAL | Status: DC | PRN

## 2021-08-31 MED ORDER — FENTANYL CITRATE (PF) 250 MCG/5ML IJ SOLN
INTRAMUSCULAR | Status: AC
  Filled 2021-08-31: qty 5

## 2021-08-31 MED ORDER — ROCURONIUM BROMIDE 10 MG/ML (PF) SYRINGE
PREFILLED_SYRINGE | INTRAVENOUS | Status: DC | PRN
  Administered 2021-08-31: 40 mg via INTRAVENOUS

## 2021-08-31 MED ORDER — PHENYLEPHRINE HCL-NACL 20-0.9 MG/250ML-% IV SOLN
INTRAVENOUS | Status: DC | PRN
  Administered 2021-08-31: 25 ug/min via INTRAVENOUS

## 2021-08-31 MED ORDER — PROPOFOL 500 MG/50ML IV EMUL
INTRAVENOUS | Status: DC | PRN
  Administered 2021-08-31: 200 ug/kg/min via INTRAVENOUS

## 2021-08-31 MED ORDER — LACTATED RINGERS IV SOLN: INTRAVENOUS | Status: DC | PRN

## 2021-08-31 MED ORDER — CEFAZOLIN SODIUM-DEXTROSE 2-4 GM/100ML-% IV SOLN
INTRAVENOUS | Status: AC
  Filled 2021-08-31: qty 100

## 2021-08-31 MED ORDER — LIDOCAINE 2% (20 MG/ML) 5 ML SYRINGE
INTRAMUSCULAR | Status: DC | PRN
  Administered 2021-08-31: 60 mg via INTRAVENOUS

## 2021-08-31 MED ORDER — KETOROLAC TROMETHAMINE 30 MG/ML IJ SOLN
INTRAMUSCULAR | Status: DC | PRN
  Administered 2021-08-31: 30 mg via INTRAVENOUS

## 2021-08-31 MED ORDER — DEXAMETHASONE SODIUM PHOSPHATE 10 MG/ML IJ SOLN
INTRAMUSCULAR | Status: AC
  Filled 2021-08-31: qty 1

## 2021-08-31 MED ORDER — SUCCINYLCHOLINE CHLORIDE 200 MG/10ML IV SOSY
PREFILLED_SYRINGE | INTRAVENOUS | Status: DC | PRN
  Administered 2021-08-31: 120 mg via INTRAVENOUS

## 2021-08-31 MED ORDER — SUGAMMADEX SODIUM 200 MG/2ML IV SOLN
INTRAVENOUS | Status: DC | PRN
  Administered 2021-08-31: 175 mg via INTRAVENOUS

## 2021-08-31 MED ORDER — IOHEXOL 300 MG/ML  SOLN
INTRAMUSCULAR | Status: DC | PRN
  Administered 2021-08-31: 04:00:00 15 mL

## 2021-08-31 MED ORDER — PROPOFOL 10 MG/ML IV BOLUS
INTRAVENOUS | Status: DC | PRN
  Administered 2021-08-31: 150 mg via INTRAVENOUS

## 2021-08-31 MED ORDER — PROMETHAZINE HCL 25 MG/ML IJ SOLN: 6.2500 mg | INTRAMUSCULAR | Status: DC | PRN

## 2021-08-31 MED ORDER — CEFAZOLIN (ANCEF) 1 G IV SOLR: 2.0000 g | INTRAVENOUS | Status: DC

## 2021-08-31 MED ORDER — POTASSIUM CHLORIDE 10 MEQ/100ML IV SOLN
10.0000 meq | Freq: Once | INTRAVENOUS | Status: AC
  Administered 2021-08-31: 03:00:00 10 meq via INTRAVENOUS
  Filled 2021-08-31: qty 100

## 2021-08-31 MED ORDER — SODIUM CHLORIDE 0.9 % IR SOLN
Status: DC | PRN
  Administered 2021-08-31: 6000 mL

## 2021-08-31 MED ORDER — HYDROMORPHONE HCL 1 MG/ML IJ SOLN
1.0000 mg | Freq: Once | INTRAMUSCULAR | Status: AC
  Administered 2021-08-31: 01:00:00 1 mg via INTRAVENOUS
  Filled 2021-08-31: qty 1

## 2021-08-31 SURGICAL SUPPLY — 24 items
BAG URO CATCHER STRL LF (MISCELLANEOUS) ×3 IMPLANT
BASKET ZERO TIP NITINOL 2.4FR (BASKET) IMPLANT
BSKT STON RTRVL ZERO TP 2.4FR (BASKET)
BULB IRRIG PATHFIND (MISCELLANEOUS) ×3 IMPLANT
CATH URETL OPEN 5X70 (CATHETERS) IMPLANT
CLOTH BEACON ORANGE TIMEOUT ST (SAFETY) ×3 IMPLANT
FIBER LASER TRACTIP 200 (UROLOGICAL SUPPLIES) ×2 IMPLANT
GLOVE SURG ENC TEXT LTX SZ7.5 (GLOVE) ×3 IMPLANT
GOWN STRL REUS W/TWL XL LVL3 (GOWN DISPOSABLE) ×3 IMPLANT
GUIDEWIRE ANG ZIPWIRE 038X150 (WIRE) IMPLANT
GUIDEWIRE STR DUAL SENSOR (WIRE) ×3 IMPLANT
KIT TURNOVER KIT A (KITS) IMPLANT
LASER FIB FLEXIVA PULSE ID 365 (Laser) IMPLANT
MANIFOLD NEPTUNE II (INSTRUMENTS) ×3 IMPLANT
PACK CYSTO (CUSTOM PROCEDURE TRAY) ×3 IMPLANT
SHEATH NAVIGATOR HD 11/13X36 (SHEATH) IMPLANT
STENT URET 6FRX24 CONTOUR (STENTS) ×4 IMPLANT
STENT URET 6FRX26 CONTOUR (STENTS) IMPLANT
SYR 20ML LL LF (SYRINGE) ×3 IMPLANT
TRACTIP FLEXIVA PULS ID 200XHI (Laser) IMPLANT
TRACTIP FLEXIVA PULSE ID 200 (Laser)
TUBING CONNECTING 10 (TUBING) ×2 IMPLANT
TUBING CONNECTING 10' (TUBING) ×1
TUBING UROLOGY SET (TUBING) ×3 IMPLANT

## 2021-08-31 NOTE — Anesthesia Postprocedure Evaluation (Signed)
Anesthesia Post Note  Patient: Virginia [name]  Procedure(s) Performed: CYSTOSCOPY WITH LEFT URETEROSCOPY, LEFT LASER LITHOTRIPSY, BILATERAL STENT PLACEMENT, BILATERAL RETROGRADES (Bilateral: Ureter)     Patient location during evaluation: PACU Anesthesia Type: General Level of consciousness: awake and alert Pain management: pain level controlled Vital Signs Assessment: post-procedure vital signs reviewed and stable Respiratory status: spontaneous breathing, nonlabored ventilation, respiratory function stable and patient connected to nasal cannula oxygen Cardiovascular status: blood pressure returned to baseline and stable Postop Assessment: no apparent nausea or vomiting Anesthetic complications: no   No notable events documented.  Last Vitals:  Vitals:   08/31/21 0422 08/31/21 0430  BP: 118/63 121/68  Pulse: (!) 112 (!) 109  Resp: 16 17  Temp: 36.7 C   SpO2: 97% 99%    Last Pain:  Vitals:   08/31/21 0445  TempSrc:   PainSc: 0-No pain                 Effie Berkshire

## 2021-08-31 NOTE — Interval H&P Note (Signed)
History and Physical Interval Note:  08/31/2021 2:49 AM  Virginia [name]  has presented today for surgery, with the diagnosis of Bilateral obstructing ureteral stones.  The various methods of treatment have been discussed with the patient and family. After consideration of risks, benefits and other options for treatment, the patient has consented to  Procedure(s): CYSTOSCOPY WITH URETEROSCOPY AND STENT PLACEMENT (Bilateral) as a surgical intervention.  The patient's history has been reviewed, patient examined, no change in status, stable for surgery.  I have reviewed the patient's chart and labs.  Questions were answered to the patient's satisfaction.     Remi Haggard

## 2021-08-31 NOTE — Op Note (Signed)
Preoperative diagnosis:  1.  Bilateral ureteral calculi with obstruction  Postoperative diagnosis: 1.  Bilateral ureteral calculi with obstruction  Procedure(s): 1.  Cystoscopy, bilateral retrograde pyelograms with intraoperative interpretation, left ureteroscopy with laser lithotripsy, left ureteral stone extraction, bilateral JJ stent insertion  Surgeon: Dr. Harold Barban  Anesthesia: General  Complications: None  EBL: Minimal  Specimens: None  Disposition of specimens: Not applicable  Intraoperative findings: Patient had 2 small left distal ureteral calculi measuring approximately 3 mm in size.  These were dusted out with the laser and extracted, 6 [place] by 24 cm Percuflex plus soft Contour stent was placed on the left.  Patient has 2.7 cm right UPJ stone, able to manipulate wire by the stone into middle calyx, 6 French by 24 cm Percuflex plus soft contour stent placed.  No tethers  Indication: Patient is a 55 year old white female presented with bilateral flank pain nausea vomiting.  She is found to have a 2.7 cm right UPJ/upper ureter stone with hydronephrosis as well as to distal left ureteral calculi measuring about 3 mm each with significant hydronephrosis.  She is presents at this time undergo cystoscopy attempt at left ureteroscopy laser lithotripsy stone extraction on the left and bilateral ureteral stent insertions  Description of procedure:  After obtaining informed consent for the patient she was taken the major cystoscopy suite placed under general anesthesia.  She is placed in the dorsolithotomy position genitalia prepped draped in usual sterile fashion.  Proper pause and timeout was performed.  Cystoscopy was carried out 21 [place] cystoscope.  Retrograde pyelogram was performed on the left side which showed the distal ureteral filling defect consistent with the small stone seen on preoperative CT scan.  A sensor wire was passed up to the left renal pelvis under  fluoroscopy without difficulty.  The cystoscope was removed.  6.4 French semirigid ureteroscope was then advanced over the guidewire into the bladder.  This was manipulated over the wire inside the left ureteral orifice without difficulty.  It was advanced over the wire up to the level of the stones.  Guidewire was removed.  The 200 m holmium laser fiber was then utilized on fragmentation right at 0.8 and 8 to fragment the stones into some smaller pieces which were then extracted utilizing an engage basket.  There was significant edema around the lodgment site of the stone.  I sent a retrograde pyelogram through the ureteroscope revealed hydronephrosis up to the left renal pelvis but no proximal filling defects.  A sensor wire was passed through the scope up to the renal pelvis and coiled the ureteroscope was removed.  Wire was back fed through the cystoscope and a 6 [place] by 24 cm Percuflex plus soft Contour stent was placed leaving a proximal coil in the renal pelvis and a distal coil in the bladder.  There was good flow of urine through and around the stent noted.  Attention was then directed towards the right side.  5 French open tip catheter was utilized perform retrograde pyelogram on the right side.  This revealed patent ureter up to the UPJ.  The kidney is somewhat anteriorly malrotated but I was able to manipulate a sensor wire through the open-ended catheter and around the stone into a middle calyceal area.  There was prompt flow of urine through and around the guidewire noted.  A 6 French by 24 cm Percuflex plus soft Contour stent was placed leaving a proximal coil in the middle calyx area and a distal coil in the  bladder.  There was again flow of urine through and around the stent noted.  The bladder was emptied procedure was terminated.  She was awakened from anesthesia and taken back to the recovery room in stable condition.  No immediate complication from the procedure.

## 2021-08-31 NOTE — Anesthesia Preprocedure Evaluation (Signed)
Anesthesia Evaluation  Patient identified by MRN, date of birth, ID band Patient awake    Reviewed: Allergy & Precautions, NPO status , Patient's Chart, lab work & pertinent test results  History of Anesthesia Complications (+) PONV and history of anesthetic complications  Airway Mallampati: II  TM Distance: >3 FB Neck ROM: Full    Dental  (+) Teeth Intact, Dental Advisory Given   Pulmonary former smoker,    breath sounds clear to auscultation       Cardiovascular negative cardio ROS   Rhythm:Regular Rate:Normal     Neuro/Psych PSYCHIATRIC DISORDERS Depression negative neurological ROS     GI/Hepatic negative GI ROS, Neg liver ROS,   Endo/Other  negative endocrine ROS  Renal/GU Renal disease     Musculoskeletal negative musculoskeletal ROS (+)   Abdominal Normal abdominal exam  (+)   Peds  Hematology negative hematology ROS (+)   Anesthesia Other Findings   Reproductive/Obstetrics                             Anesthesia Physical Anesthesia Plan  ASA: 2 and emergent  Anesthesia Plan: General   Post-op Pain Management:    Induction: Intravenous, Rapid sequence and Cricoid pressure planned  PONV Risk Score and Plan: 4 or greater and Ondansetron, Dexamethasone, Midazolam and Scopolamine patch - Pre-op  Airway Management Planned: Oral ETT  Additional Equipment: None  Intra-op Plan:   Post-operative Plan: Extubation in OR  Informed Consent: I have reviewed the patients History and Physical, chart, labs and discussed the procedure including the risks, benefits and alternatives for the proposed anesthesia with the patient or authorized representative who has indicated his/her understanding and acceptance.     Dental advisory given  Plan Discussed with: CRNA  Anesthesia Plan Comments:         Anesthesia Quick Evaluation

## 2021-08-31 NOTE — Anesthesia Procedure Notes (Signed)
Procedure Name: Intubation Date/Time: 08/31/2021 3:06 AM Performed by: Lissa Morales, CRNA Pre-anesthesia Checklist: Patient identified, Emergency Drugs available, Suction available and Patient being monitored Patient Re-evaluated:Patient Re-evaluated prior to induction Oxygen Delivery Method: Circle system utilized Preoxygenation: Pre-oxygenation with 100% oxygen Induction Type: IV induction, Rapid sequence and Cricoid Pressure applied Laryngoscope Size: Mac and 4 Grade View: Grade I Tube type: Oral Tube size: 7.5 mm Number of attempts: 1 Airway Equipment and Method: Stylet and Oral airway Placement Confirmation: ETT inserted through vocal cords under direct vision, positive ETCO2 and breath sounds checked- equal and bilateral Secured at: 21 cm Tube secured with: Tape Dental Injury: Teeth and Oropharynx as per pre-operative assessment
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
## 2021-08-31 NOTE — Transfer of Care (Signed)
Immediate Anesthesia Transfer of Care Note  Patient: Virginia [name]  Procedure(s) Performed: CYSTOSCOPY WITH LEFT URETEROSCOPY, LEFT LASER LITHOTRIPSY, BILATERAL STENT PLACEMENT, BILATERAL RETROGRADES (Bilateral: Ureter)  Patient Location: PACU  Anesthesia Type:General  Level of Consciousness: awake, alert , oriented and patient cooperative  Airway & Oxygen Therapy: Patient Spontanous Breathing and Patient connected to face mask oxygen  Post-op Assessment: Report given to RN, Post -op Vital signs reviewed and stable and Patient moving all extremities X 4  Post vital signs: stable  Last Vitals:  Vitals Value Taken Time  BP 121/68 08/31/21 0430  Temp 36.7 C 08/31/21 0422  Pulse 118 08/31/21 0432  Resp 18 08/31/21 0432  SpO2 94 % 08/31/21 0432  Vitals shown include unvalidated device data.  Last Pain:  Vitals:   08/31/21 0425  TempSrc:   PainSc: 0-No pain         Complications: No notable events documented.

## 2021-08-31 NOTE — ED Notes (Signed)
[facility] at facility for transport 

## 2021-09-01 ENCOUNTER — Encounter (HOSPITAL_COMMUNITY): Payer: Self-pay | Admitting: Urology

## 2021-09-14 ENCOUNTER — Other Ambulatory Visit: Payer: Self-pay | Admitting: Urology

## 2021-09-16 NOTE — Patient Instructions (Signed)
DUE TO COVID-19 ONLY ONE VISITOR IS ALLOWED TO COME WITH YOU AND STAY IN THE WAITING ROOM ONLY DURING PRE OP AND PROCEDURE.   **NO VISITORS ARE ALLOWED IN THE SHORT STAY AREA OR RECOVERY ROOM!!**  IF YOU WILL BE ADMITTED INTO THE HOSPITAL YOU ARE ALLOWED ONLY TWO SUPPORT PEOPLE DURING VISITATION HOURS ONLY (7 AM -8PM)    Up to two visitors ages 80+ are allowed at one time in a patient's room.  The visitors may rotate out with other people throughout the day.  Additionally, up to two children between the ages of 72 and 47 are allowed and do not count toward the number of allowed visitors.  Children within this age range must be accompanied by an adult visitor.  One adult visitor may remain with the patient overnight and must be in the room by 8 PM.  COVID SWAB TESTING MUST BE COMPLETED ON:  09-27-21 @ 9:00 AM  COME IN THROUGH MAIN ENTRANCE of [facility].  Take a seat in the lobby area to the right as you come in the main entrance.  Call [phone]  and give your name and let them know you are here for COVID testing  You are not required to quarantine, however you are required to wear a well-fitted mask when you are out and around people not in your household.  Hand Hygiene often Do NOT share personal items Notify your provider if you are in close contact with someone who has COVID or you develop fever 100.4 or greater, new onset of sneezing, cough, sore throat, shortness of breath or body aches.       Your procedure is scheduled on:  Wednesday 09-29-21   Report to [facility] Main  Entrance     Report to admitting at 5:15 AM   Call this number if you have problems the morning of surgery [phone]   Do not eat food :After Midnight.   May have liquids until 4:30 AM day of surgery  CLEAR LIQUID DIET  Foods Allowed                                                                     Foods Excluded  Water, Black Coffee (no milk/no creamer) and tea, regular and decaf                               liquids that you cannot  Plain Jell-O in any flavor  (No red)                         see through such as: Fruit ices (not with fruit pulp)                                 milk, soups, orange juice  Iced Popsicles (No red)                                    All solid food  Apple juices Sports drinks like Gatorade (No red) Lightly seasoned clear broth or consume(fat free) Sugar   Oral Hygiene is also important to reduce your risk of infection.                                    Remember - BRUSH YOUR TEETH THE MORNING OF SURGERY WITH YOUR REGULAR TOOTHPASTE   Do NOT smoke after Midnight   Take these medicines the morning of surgery with A SIP OF WATER:     Stop all vitamins and herbal supplements a week before surgery             You may not have any metal on your body including hair pins, jewelry, and body piercing             Do not wear make-up, lotions, powders, perfumes/cologne, or deodorant  Do not wear nail polish including gel and S&S, artificial/acrylic nails, or any other type of covering on natural nails including finger and toenails. If you have artificial nails, gel coating, etc. that needs to be removed by a nail salon please have this removed prior to surgery or surgery may need to be canceled/ delayed if the surgeon/ anesthesia feels like they are unable to be safely monitored.   Do not shave  48 hours prior to surgery.   Do not bring valuables to the hospital. [place].   Contacts, dentures or bridgework may not be worn into surgery.   Bring small overnight bag day of surgery.  Special Instructions: Bring a copy of your healthcare power of attorney and living will documents the day of surgery if you haven't scanned them in before.  Please read over the following fact sheets you were given: IF YOU HAVE QUESTIONS ABOUT YOUR PRE OP INSTRUCTIONS PLEASE CALL [phone]   [place] -  Preparing for Surgery Before surgery, you can play an important role.  Because skin is not sterile, your skin needs to be as free of germs as possible.  You can reduce the number of germs on your skin by washing with CHG (chlorahexidine gluconate) soap before surgery.  CHG is an antiseptic cleaner which kills germs and bonds with the skin to continue killing germs even after washing. Please DO NOT use if you have an allergy to CHG or antibacterial soaps.  If your skin becomes reddened/irritated stop using the CHG and inform your nurse when you arrive at Short Stay. Do not shave (including legs and underarms) for at least 48 hours prior to the first CHG shower.  You may shave your face/neck.  Please follow these instructions carefully:  1.  Shower with CHG Soap the night before surgery and the  morning of surgery.  2.  If you choose to wash your hair, wash your hair first as usual with your normal  shampoo.  3.  After you shampoo, rinse your hair and body thoroughly to remove the shampoo.                             4.  Use CHG as you would any other liquid soap.  You can apply chg directly to the skin and wash.  Gently with a scrungie or clean washcloth.  5.  Apply the CHG Soap to your body ONLY FROM THE NECK DOWN.   Do  not use on face/ open                           Wound or open sores. Avoid contact with eyes, ears mouth and   genitals (private parts).                       Wash face,  Genitals (private parts) with your normal soap.             6.  Wash thoroughly, paying special attention to the area where your    surgery  will be performed.  7.  Thoroughly rinse your body with warm water from the neck down.  8.  DO NOT shower/wash with your normal soap after using and rinsing off the CHG Soap.                9.  Pat yourself dry with a clean towel.            10.  Wear clean pajamas.            11.  Place clean sheets on your bed the night of your first shower and do not  sleep with  pets. Day of Surgery : Do not apply any lotions/deodorants the morning of surgery.  Please wear clean clothes to the hospital/surgery center.  FAILURE TO FOLLOW THESE INSTRUCTIONS MAY RESULT IN THE CANCELLATION OF YOUR SURGERY  PATIENT SIGNATURE_________________________________  NURSE SIGNATURE__________________________________  ________________________________________________________________________  WHAT IS A BLOOD TRANSFUSION? Blood Transfusion Information  A transfusion is the replacement of blood or some of its parts. Blood is made up of multiple cells which provide different functions. Red blood cells carry oxygen and are used for blood loss replacement. White blood cells fight against infection. Platelets control bleeding. Plasma helps clot blood. Other blood products are available for specialized needs, such as hemophilia or other clotting disorders. BEFORE THE TRANSFUSION  Who gives blood for transfusions?  Healthy volunteers who are fully evaluated to make sure their blood is safe. This is blood bank blood. Transfusion therapy is the safest it has ever been in the practice of medicine. Before blood is taken from a donor, a complete history is taken to make sure that person has no history of diseases nor engages in risky social behavior (examples are intravenous drug use or sexual activity with multiple partners). The donor's travel history is screened to minimize risk of transmitting infections, such as malaria. The donated blood is tested for signs of infectious diseases, such as HIV and hepatitis. The blood is then tested to be sure it is compatible with you in order to minimize the chance of a transfusion reaction. If you or a relative donates blood, this is often done in anticipation of surgery and is not appropriate for emergency situations. It takes many days to process the donated blood. RISKS AND COMPLICATIONS Although transfusion therapy is very safe and saves many lives,  the main dangers of transfusion include:  Getting an infectious disease. Developing a transfusion reaction. This is an allergic reaction to something in the blood you were given. Every precaution is taken to prevent this. The decision to have a blood transfusion has been considered carefully by your caregiver before blood is given. Blood is not given unless the benefits outweigh the risks. AFTER THE TRANSFUSION Right after receiving a blood transfusion, you will usually feel much better and more energetic. This is especially true if your red  blood cells have gotten low (anemic). The transfusion raises the level of the red blood cells which carry oxygen, and this usually causes an energy increase. The nurse administering the transfusion will monitor you carefully for complications. HOME CARE INSTRUCTIONS  No special instructions are needed after a transfusion. You may find your energy is better. Speak with your caregiver about any limitations on activity for underlying diseases you may have. SEEK MEDICAL CARE IF:  Your condition is not improving after your transfusion. You develop redness or irritation at the intravenous (IV) site. SEEK IMMEDIATE MEDICAL CARE IF:  Any of the following symptoms occur over the next 12 hours: Shaking chills. You have a temperature by mouth above 102 F (38.9 C), not controlled by medicine. Chest, back, or muscle pain. People around you feel you are not acting correctly or are confused. Shortness of breath or difficulty breathing. Dizziness and fainting. You get a rash or develop hives. You have a decrease in urine output. Your urine turns a dark color or changes to pink, red, or brown. Any of the following symptoms occur over the next 10 days: You have a temperature by mouth above 102 F (38.9 C), not controlled by medicine. Shortness of breath. Weakness after normal activity. The white part of the eye turns yellow (jaundice). You have a decrease in the  amount of urine or are urinating less often. Your urine turns a dark color or changes to pink, red, or brown. Document Released: 08/19/2000 Document Revised: 11/14/2011 Document Reviewed: 04/07/2008 [facility] Patient Information 2014 [place], [place].  _______________________________________________________________________

## 2021-09-21 ENCOUNTER — Encounter (HOSPITAL_COMMUNITY)
Admission: RE | Admit: 2021-09-21 | Discharge: 2021-09-21 | Disposition: A | Discharge status: Home / Self Care | Payer: Self-pay | Source: Ambulatory Visit | Attending: Anesthesiology | Admitting: Anesthesiology

## 2021-09-21 DIAGNOSIS — Z01818 Encounter for other preprocedural examination: Secondary | ICD-10-CM

## 2021-09-22 ENCOUNTER — Encounter (HOSPITAL_COMMUNITY): Payer: Self-pay

## 2021-09-22 ENCOUNTER — Other Ambulatory Visit (HOSPITAL_COMMUNITY): Payer: Self-pay

## 2021-09-22 NOTE — Patient Instructions (Signed)
DUE TO COVID-19 ONLY ONE VISITOR IS ALLOWED TO COME WITH YOU AND STAY IN THE WAITING ROOM ONLY DURING PRE OP AND PROCEDURE.   **NO VISITORS ARE ALLOWED IN THE SHORT STAY AREA OR RECOVERY ROOM!!**  IF YOU WILL BE ADMITTED INTO THE HOSPITAL YOU ARE ALLOWED ONLY TWO SUPPORT PEOPLE DURING VISITATION HOURS ONLY (7 AM -8PM)    Up to two visitors ages 72+ are allowed at one time in a patient's room.  The visitors may rotate out with other people throughout the day.  Additionally, up to two children between the ages of 59 and 19 are allowed and do not count toward the number of allowed visitors.  Children within this age range must be accompanied by an adult visitor.  One adult visitor may remain with the patient overnight and must be in the room by 8 PM.  COVID SWAB TESTING MUST BE COMPLETED ON:  09-27-21 @ 9:00 AM  COME IN THROUGH MAIN ENTRANCE of [facility].  Take a seat in the lobby area to the right as you come in the main entrance.  Call [phone]  and give your name and let them know you are here for COVID testing  You are not required to quarantine, however you are required to wear a well-fitted mask when you are out and around people not in your household.  Hand Hygiene often Do NOT share personal items Notify your provider if you are in close contact with someone who has COVID or you develop fever 100.4 or greater, new onset of sneezing, cough, sore throat, shortness of breath or body aches.       Your procedure is scheduled on:  Wednesday 09-29-21   Report to [facility] Main  Entrance     Report to admitting at 5:15 AM   Call this number if you have problems the morning of surgery [phone]   Do not eat food :After Midnight.   May have liquids until 4:30 AM day of surgery  CLEAR LIQUID DIET  Foods Allowed                                                                     Foods Excluded  Water, Black Coffee (no milk/no creamer) and tea, regular and decaf                               liquids that you cannot  Plain Jell-O in any flavor  (No red)                         see through such as: Fruit ices (not with fruit pulp)                                 milk, soups, orange juice  Iced Popsicles (No red)                                    All solid food  Apple juices Sports drinks like Gatorade (No red) Lightly seasoned clear broth or consume(fat free) Sugar   Oral Hygiene is also important to reduce your risk of infection.                                    Remember - BRUSH YOUR TEETH THE MORNING OF SURGERY WITH YOUR REGULAR TOOTHPASTE   Do NOT smoke after Midnight   Take these medicines the morning of surgery with A SIP OF WATER:  NONE   Stop all vitamins and herbal supplements a week before surgery             You may not have any metal on your body including hair pins, jewelry, and body piercing             Do not wear make-up, lotions, powders, perfumes/cologne, or deodorant  Do not wear nail polish including gel and S&S, artificial/acrylic nails, or any other type of covering on natural nails including finger and toenails. If you have artificial nails, gel coating, etc. that needs to be removed by a nail salon please have this removed prior to surgery or surgery may need to be canceled/ delayed if the surgeon/ anesthesia feels like they are unable to be safely monitored.   Do not shave  48 hours prior to surgery.   Do not bring valuables to the hospital. [facility].   Contacts, dentures or bridgework may not be worn into surgery.   Bring small overnight bag day of surgery.  Special Instructions: Bring a copy of your healthcare power of attorney and living will documents the day of surgery if you haven't scanned them in before.  Please read over the following fact sheets you were given: IF YOU HAVE QUESTIONS ABOUT YOUR PRE OP INSTRUCTIONS PLEASE CALL [phone]   [facility] -  Preparing for Surgery Before surgery, you can play an important role.  Because skin is not sterile, your skin needs to be as free of germs as possible.  You can reduce the number of germs on your skin by washing with CHG (chlorahexidine gluconate) soap before surgery.  CHG is an antiseptic cleaner which kills germs and bonds with the skin to continue killing germs even after washing. Please DO NOT use if you have an allergy to CHG or antibacterial soaps.  If your skin becomes reddened/irritated stop using the CHG and inform your nurse when you arrive at Short Stay. Do not shave (including legs and underarms) for at least 48 hours prior to the first CHG shower.  You may shave your face/neck.  Please follow these instructions carefully:  1.  Shower with CHG Soap the night before surgery and the  morning of surgery.  2.  If you choose to wash your hair, wash your hair first as usual with your normal  shampoo.  3.  After you shampoo, rinse your hair and body thoroughly to remove the shampoo.                             4.  Use CHG as you would any other liquid soap.  You can apply chg directly to the skin and wash.  Gently with a scrungie or clean washcloth.  5.  Apply the CHG Soap to your body ONLY FROM THE NECK DOWN.   Do  not use on face/ open                           Wound or open sores. Avoid contact with eyes, ears mouth and   genitals (private parts).                       Wash face,  Genitals (private parts) with your normal soap.             6.  Wash thoroughly, paying special attention to the area where your    surgery  will be performed.  7.  Thoroughly rinse your body with warm water from the neck down.  8.  DO NOT shower/wash with your normal soap after using and rinsing off the CHG Soap.                9.  Pat yourself dry with a clean towel.            10.  Wear clean pajamas.            11.  Place clean sheets on your bed the night of your first shower and do not  sleep with  pets. Day of Surgery : Do not apply any lotions/deodorants the morning of surgery.  Please wear clean clothes to the hospital/surgery center.  FAILURE TO FOLLOW THESE INSTRUCTIONS MAY RESULT IN THE CANCELLATION OF YOUR SURGERY  PATIENT SIGNATURE_________________________________  NURSE SIGNATURE__________________________________  ________________________________________________________________________  WHAT IS A BLOOD TRANSFUSION? Blood Transfusion Information  A transfusion is the replacement of blood or some of its parts. Blood is made up of multiple cells which provide different functions. Red blood cells carry oxygen and are used for blood loss replacement. White blood cells fight against infection. Platelets control bleeding. Plasma helps clot blood. Other blood products are available for specialized needs, such as hemophilia or other clotting disorders. BEFORE THE TRANSFUSION  Who gives blood for transfusions?  Healthy volunteers who are fully evaluated to make sure their blood is safe. This is blood bank blood. Transfusion therapy is the safest it has ever been in the practice of medicine. Before blood is taken from a donor, a complete history is taken to make sure that person has no history of diseases nor engages in risky social behavior (examples are intravenous drug use or sexual activity with multiple partners). The donor's travel history is screened to minimize risk of transmitting infections, such as malaria. The donated blood is tested for signs of infectious diseases, such as HIV and hepatitis. The blood is then tested to be sure it is compatible with you in order to minimize the chance of a transfusion reaction. If you or a relative donates blood, this is often done in anticipation of surgery and is not appropriate for emergency situations. It takes many days to process the donated blood. RISKS AND COMPLICATIONS Although transfusion therapy is very safe and saves many lives,  the main dangers of transfusion include:  Getting an infectious disease. Developing a transfusion reaction. This is an allergic reaction to something in the blood you were given. Every precaution is taken to prevent this. The decision to have a blood transfusion has been considered carefully by your caregiver before blood is given. Blood is not given unless the benefits outweigh the risks. AFTER THE TRANSFUSION Right after receiving a blood transfusion, you will usually feel much better and more energetic. This is especially true if your red  blood cells have gotten low (anemic). The transfusion raises the level of the red blood cells which carry oxygen, and this usually causes an energy increase. The nurse administering the transfusion will monitor you carefully for complications. HOME CARE INSTRUCTIONS  No special instructions are needed after a transfusion. You may find your energy is better. Speak with your caregiver about any limitations on activity for underlying diseases you may have. SEEK MEDICAL CARE IF:  Your condition is not improving after your transfusion. You develop redness or irritation at the intravenous (IV) site. SEEK IMMEDIATE MEDICAL CARE IF:  Any of the following symptoms occur over the next 12 hours: Shaking chills. You have a temperature by mouth above 102 F (38.9 C), not controlled by medicine. Chest, back, or muscle pain. People around you feel you are not acting correctly or are confused. Shortness of breath or difficulty breathing. Dizziness and fainting. You get a rash or develop hives. You have a decrease in urine output. Your urine turns a dark color or changes to pink, red, or brown. Any of the following symptoms occur over the next 10 days: You have a temperature by mouth above 102 F (38.9 C), not controlled by medicine. Shortness of breath. Weakness after normal activity. The white part of the eye turns yellow (jaundice). You have a decrease in the  amount of urine or are urinating less often. Your urine turns a dark color or changes to pink, red, or brown. Document Released: 08/19/2000 Document Revised: 11/14/2011 Document Reviewed: 04/07/2008 [facility] Patient Information 2014 [place], [place].  _______________________________________________________________________

## 2021-09-27 ENCOUNTER — Encounter (HOSPITAL_COMMUNITY): Payer: Self-pay | Admitting: Radiology

## 2021-09-27 ENCOUNTER — Encounter (HOSPITAL_COMMUNITY)
Admission: RE | Admit: 2021-09-27 | Discharge: 2021-09-27 | Disposition: A | Discharge status: Home / Self Care | Payer: Self-pay | Source: Ambulatory Visit | Attending: Urology | Admitting: Urology

## 2021-09-27 ENCOUNTER — Other Ambulatory Visit: Payer: Self-pay

## 2021-09-27 ENCOUNTER — Encounter (HOSPITAL_COMMUNITY): Payer: Self-pay

## 2021-09-27 VITALS — BP 107/74 | HR 83 | Temp 98.2°F | Resp 16 | Ht 68.0 in | Wt 171.0 lb

## 2021-09-27 DIAGNOSIS — Z01818 Encounter for other preprocedural examination: Secondary | ICD-10-CM

## 2021-09-27 DIAGNOSIS — Z01812 Encounter for preprocedural laboratory examination: Secondary | ICD-10-CM | POA: Insufficient documentation

## 2021-09-27 DIAGNOSIS — Z20822 Contact with and (suspected) exposure to covid-19: Secondary | ICD-10-CM | POA: Insufficient documentation

## 2021-09-27 HISTORY — DX: Personal history of urinary calculi: Z87.442

## 2021-09-27 HISTORY — DX: Gastro-esophageal reflux disease without esophagitis: K21.9

## 2021-09-27 HISTORY — DX: Anxiety disorder, unspecified: F41.9

## 2021-09-27 LAB — CBC
HCT: 37.4 % (ref 36.0–46.0)
Hemoglobin: 12.5 g/dL (ref 12.0–15.0)
MCH: 31.2 pg (ref 26.0–34.0)
MCHC: 33.4 g/dL (ref 30.0–36.0)
MCV: 93.3 fL (ref 80.0–100.0)
Platelets: 231 10*3/uL (ref 150–400)
RBC: 4.01 MIL/uL (ref 3.87–5.11)
RDW: 12.2 % (ref 11.5–15.5)
WBC: 5.8 10*3/uL (ref 4.0–10.5)
nRBC: 0 % (ref 0.0–0.2)

## 2021-09-27 LAB — BASIC METABOLIC PANEL
Anion gap: 7 (ref 5–15)
BUN: 18 mg/dL (ref 6–20)
CO2: 23 mmol/L (ref 22–32)
Calcium: 9 mg/dL (ref 8.9–10.3)
Chloride: 106 mmol/L (ref 98–111)
Creatinine, Ser: 0.54 mg/dL (ref 0.44–1.00)
GFR, Estimated: >60 mL/min (ref >60)
Glucose, Bld: 127 mg/dL — ABNORMAL HIGH (ref 70–99)
Potassium: 3.4 mmol/L — ABNORMAL LOW (ref 3.5–5.1)
Sodium: 136 mmol/L (ref 135–145)

## 2021-09-27 LAB — SARS CORONAVIRUS 2 (TAT 6-24 HRS): SARS Coronavirus 2: NEGATIVE

## 2021-09-27 NOTE — Progress Notes (Signed)
PCP - none Cardiologist - no  PPM/ICD -  Device Orders -  Rep Notified -   Chest x-ray -  EKG -  Stress Test -  ECHO -  Cardiac Cath -   Sleep Study -  CPAP -   Fasting Blood Sugar -  Checks Blood Sugar _____ times a day  Blood Thinner Instructions: Aspirin Instructions:  ERAS Protcol - PRE-SURGERY Ensure or G2-   COVID TEST- 09-27-21 COVID vaccine -x2 Pfizer  Activity--Able to walk a flight of stairs without SOB Anesthesia review:   Patient denies shortness of breath, fever, cough and chest pain at PAT appointment   All instructions explained to the patient, with a verbal understanding of the material. Patient agrees to go over the instructions while at home for a better understanding. Patient also instructed to self quarantine after being tested for COVID-19. The opportunity to ask questions was provided.

## 2021-09-28 LAB — URINE CULTURE: Culture: NO GROWTH

## 2021-09-28 NOTE — Anesthesia Preprocedure Evaluation (Addendum)
Anesthesia Evaluation  Patient identified by MRN, date of birth, ID band Patient awake    Reviewed: Allergy & Precautions, NPO status , Patient's Chart, lab work & pertinent test results  History of Anesthesia Complications (+) PONV  Airway Mallampati: II  TM Distance: >3 FB Neck ROM: Full    Dental no notable dental hx.    Pulmonary neg pulmonary ROS, former smoker,    Pulmonary exam normal breath sounds clear to auscultation       Cardiovascular negative cardio ROS Normal cardiovascular exam Rhythm:Regular Rate:Normal     Neuro/Psych negative neurological ROS  negative psych ROS   GI/Hepatic Neg liver ROS, GERD  ,  Endo/Other  negative endocrine ROS  Renal/GU negative Renal ROS  negative genitourinary   Musculoskeletal negative musculoskeletal ROS (+)   Abdominal   Peds negative pediatric ROS (+)  Hematology negative hematology ROS (+)   Anesthesia Other Findings   Reproductive/Obstetrics negative OB ROS                            Anesthesia Physical Anesthesia Plan  ASA: 2  Anesthesia Plan: General   Post-op Pain Management: Dilaudid IV   Induction: Intravenous  PONV Risk Score and Plan: 4 or greater and Ondansetron, Dexamethasone, Scopolamine patch - Pre-op, Treatment may vary due to age or medical condition, Droperidol and Midazolam  Airway Management Planned: Oral ETT  Additional Equipment:   Intra-op Plan:   Post-operative Plan: Extubation in OR  Informed Consent: I have reviewed the patients History and Physical, chart, labs and discussed the procedure including the risks, benefits and alternatives for the proposed anesthesia with the patient or authorized representative who has indicated his/her understanding and acceptance.     Dental advisory given  Plan Discussed with: CRNA and Surgeon  Anesthesia Plan Comments:        Anesthesia Quick  Evaluation

## 2021-09-29 ENCOUNTER — Encounter (HOSPITAL_COMMUNITY): Payer: Self-pay | Admitting: Urology

## 2021-09-29 ENCOUNTER — Encounter (HOSPITAL_COMMUNITY)
Admission: RE | Disposition: A | Discharge status: Home / Self Care | Payer: Self-pay | Source: Home / Self Care | Attending: Urology

## 2021-09-29 ENCOUNTER — Other Ambulatory Visit: Payer: Self-pay

## 2021-09-29 ENCOUNTER — Ambulatory Visit (HOSPITAL_COMMUNITY): Payer: Self-pay

## 2021-09-29 ENCOUNTER — Ambulatory Visit (HOSPITAL_COMMUNITY): Payer: Self-pay | Admitting: Anesthesiology

## 2021-09-29 ENCOUNTER — Observation Stay (HOSPITAL_COMMUNITY)
Admission: RE | Admit: 2021-09-29 | Discharge: 2021-09-30 | Disposition: A | Discharge status: Home / Self Care | Payer: Self-pay | Attending: Urology | Admitting: Urology

## 2021-09-29 DIAGNOSIS — N200 Calculus of kidney: Secondary | ICD-10-CM

## 2021-09-29 DIAGNOSIS — E119 Type 2 diabetes mellitus without complications: Secondary | ICD-10-CM | POA: Insufficient documentation

## 2021-09-29 DIAGNOSIS — N202 Calculus of kidney with calculus of ureter: Principal | ICD-10-CM | POA: Insufficient documentation

## 2021-09-29 HISTORY — PX: NEPHROLITHOTOMY: SHX5134

## 2021-09-29 LAB — CBC
HCT: 33.2 % — ABNORMAL LOW (ref 36.0–46.0)
Hemoglobin: 10.8 g/dL — ABNORMAL LOW (ref 12.0–15.0)
MCH: 31.5 pg (ref 26.0–34.0)
MCHC: 32.5 g/dL (ref 30.0–36.0)
MCV: 96.8 fL (ref 80.0–100.0)
Platelets: 175 10*3/uL (ref 150–400)
RBC: 3.43 MIL/uL — ABNORMAL LOW (ref 3.87–5.11)
RDW: 12.4 % (ref 11.5–15.5)
WBC: 6.9 10*3/uL (ref 4.0–10.5)
nRBC: 0 % (ref 0.0–0.2)

## 2021-09-29 LAB — BASIC METABOLIC PANEL
Anion gap: 6 (ref 5–15)
BUN: 17 mg/dL (ref 6–20)
CO2: 23 mmol/L (ref 22–32)
Calcium: 8.2 mg/dL — ABNORMAL LOW (ref 8.9–10.3)
Chloride: 108 mmol/L (ref 98–111)
Creatinine, Ser: 0.76 mg/dL (ref 0.44–1.00)
GFR, Estimated: >60 mL/min (ref >60)
Glucose, Bld: 196 mg/dL — ABNORMAL HIGH (ref 70–99)
Potassium: 3.2 mmol/L — ABNORMAL LOW (ref 3.5–5.1)
Sodium: 137 mmol/L (ref 135–145)

## 2021-09-29 LAB — TYPE AND SCREEN
ABO/RH(D): O POS
Antibody Screen: NEGATIVE

## 2021-09-29 LAB — ABO/RH: ABO/RH(D): O POS

## 2021-09-29 SURGERY — NEPHROLITHOTOMY PERCUTANEOUS
Anesthesia: General | Laterality: Right

## 2021-09-29 MED ORDER — BUPIVACAINE-EPINEPHRINE (PF) 0.5% -1:200000 IJ SOLN
INTRAMUSCULAR | Status: AC
  Filled 2021-09-29: qty 30

## 2021-09-29 MED ORDER — EPHEDRINE 5 MG/ML INJ
INTRAVENOUS | Status: AC
  Filled 2021-09-29: qty 5

## 2021-09-29 MED ORDER — FENTANYL CITRATE (PF) 250 MCG/5ML IJ SOLN
INTRAMUSCULAR | Status: AC
  Filled 2021-09-29: qty 5

## 2021-09-29 MED ORDER — TRAMADOL HCL 50 MG PO TABS: 50.0000 mg | ORAL_TABLET | Freq: Four times a day (QID) | ORAL | 0 refills | Status: AC | PRN

## 2021-09-29 MED ORDER — DEXAMETHASONE SODIUM PHOSPHATE 10 MG/ML IJ SOLN
INTRAMUSCULAR | Status: DC | PRN
  Administered 2021-09-29: 10 mg via INTRAVENOUS

## 2021-09-29 MED ORDER — TRAMADOL HCL 50 MG PO TABS
ORAL_TABLET | ORAL | Status: AC
  Filled 2021-09-29: qty 2

## 2021-09-29 MED ORDER — BUPIVACAINE-EPINEPHRINE 0.5% -1:200000 IJ SOLN
INTRAMUSCULAR | Status: DC | PRN
  Administered 2021-09-29: 30 mL

## 2021-09-29 MED ORDER — PROMETHAZINE HCL 25 MG/ML IJ SOLN: 6.2500 mg | INTRAMUSCULAR | Status: DC | PRN

## 2021-09-29 MED ORDER — ROCURONIUM BROMIDE 10 MG/ML (PF) SYRINGE
PREFILLED_SYRINGE | INTRAVENOUS | Status: AC
  Filled 2021-09-29: qty 20

## 2021-09-29 MED ORDER — ONDANSETRON HCL 4 MG/2ML IJ SOLN
INTRAMUSCULAR | Status: AC
  Filled 2021-09-29: qty 2

## 2021-09-29 MED ORDER — HYDROMORPHONE HCL 1 MG/ML IJ SOLN
INTRAMUSCULAR | Status: AC
  Filled 2021-09-29: qty 2

## 2021-09-29 MED ORDER — ROCURONIUM BROMIDE 10 MG/ML (PF) SYRINGE
PREFILLED_SYRINGE | INTRAVENOUS | Status: DC | PRN
  Administered 2021-09-29: 20 mg via INTRAVENOUS
  Administered 2021-09-29: 10 mg via INTRAVENOUS
  Administered 2021-09-29: 70 mg via INTRAVENOUS

## 2021-09-29 MED ORDER — PHENYLEPHRINE 40 MCG/ML (10ML) SYRINGE FOR IV PUSH (FOR BLOOD PRESSURE SUPPORT)
PREFILLED_SYRINGE | INTRAVENOUS | Status: AC
  Filled 2021-09-29: qty 10

## 2021-09-29 MED ORDER — SODIUM CHLORIDE 0.9 % IV SOLN
INTRAVENOUS | Status: DC
  Administered 2021-09-29: 13:00:00 1000 mL via INTRAVENOUS

## 2021-09-29 MED ORDER — MIDAZOLAM HCL 5 MG/5ML IJ SOLN
INTRAMUSCULAR | Status: DC | PRN
  Administered 2021-09-29: 2 mg via INTRAVENOUS

## 2021-09-29 MED ORDER — SUGAMMADEX SODIUM 200 MG/2ML IV SOLN
INTRAVENOUS | Status: DC | PRN
  Administered 2021-09-29: 200 mg via INTRAVENOUS

## 2021-09-29 MED ORDER — PROPOFOL 10 MG/ML IV BOLUS
INTRAVENOUS | Status: DC | PRN
  Administered 2021-09-29: 130 mg via INTRAVENOUS

## 2021-09-29 MED ORDER — LACTATED RINGERS IV SOLN: INTRAVENOUS | Status: DC

## 2021-09-29 MED ORDER — SODIUM CHLORIDE 0.9% FLUSH: 3.0000 mL | Freq: Two times a day (BID) | INTRAVENOUS | Status: DC

## 2021-09-29 MED ORDER — EPHEDRINE SULFATE-NACL 50-0.9 MG/10ML-% IV SOSY
PREFILLED_SYRINGE | INTRAVENOUS | Status: DC | PRN
  Administered 2021-09-29: 10 mg via INTRAVENOUS

## 2021-09-29 MED ORDER — ACETAMINOPHEN 10 MG/ML IV SOLN
1000.0000 mg | Freq: Four times a day (QID) | INTRAVENOUS | Status: DC
  Administered 2021-09-29 – 2021-09-30 (×3): 1000 mg via INTRAVENOUS
  Filled 2021-09-29 (×3): qty 100

## 2021-09-29 MED ORDER — DOXYLAMINE SUCCINATE (SLEEP) 25 MG PO TABS
25.0000 mg | ORAL_TABLET | Freq: Every day | ORAL | Status: DC
  Administered 2021-09-29: 21:00:00 25 mg via ORAL
  Filled 2021-09-29: qty 1

## 2021-09-29 MED ORDER — ONDANSETRON HCL 4 MG/2ML IJ SOLN
INTRAMUSCULAR | Status: DC | PRN
Start: 2021-09-29 — End: 2021-09-29
  Administered 2021-09-29: 4 mg via INTRAVENOUS

## 2021-09-29 MED ORDER — SERTRALINE HCL 100 MG PO TABS
100.0000 mg | ORAL_TABLET | Freq: Every day | ORAL | Status: DC
Start: 2021-09-29 — End: 2021-09-30
  Administered 2021-09-29: 21:00:00 100 mg via ORAL
  Filled 2021-09-29: qty 1

## 2021-09-29 MED ORDER — CEFAZOLIN SODIUM-DEXTROSE 2-4 GM/100ML-% IV SOLN
2.0000 g | INTRAVENOUS | Status: AC
  Administered 2021-09-29: 08:00:00 2 g via INTRAVENOUS
  Filled 2021-09-29: qty 100

## 2021-09-29 MED ORDER — IOHEXOL 300 MG/ML  SOLN
INTRAMUSCULAR | Status: DC | PRN
  Administered 2021-09-29: 09:00:00 100 mL

## 2021-09-29 MED ORDER — DEXAMETHASONE SODIUM PHOSPHATE 10 MG/ML IJ SOLN
INTRAMUSCULAR | Status: AC
  Filled 2021-09-29: qty 1

## 2021-09-29 MED ORDER — FENTANYL CITRATE (PF) 250 MCG/5ML IJ SOLN
INTRAMUSCULAR | Status: DC | PRN
  Administered 2021-09-29 (×5): 50 ug via INTRAVENOUS

## 2021-09-29 MED ORDER — ESTRADIOL 1 MG PO TABS
1.0000 mg | ORAL_TABLET | Freq: Every day | ORAL | Status: DC
  Administered 2021-09-29: 21:00:00 1 mg via ORAL
  Filled 2021-09-29: qty 1

## 2021-09-29 MED ORDER — TRAMADOL HCL 50 MG PO TABS
50.0000 mg | ORAL_TABLET | Freq: Four times a day (QID) | ORAL | Status: DC | PRN
Start: 2021-09-29 — End: 2021-09-30

## 2021-09-29 MED ORDER — PROPOFOL 10 MG/ML IV BOLUS
INTRAVENOUS | Status: AC
  Filled 2021-09-29: qty 20

## 2021-09-29 MED ORDER — TRAMADOL HCL 50 MG PO TABS
100.0000 mg | ORAL_TABLET | Freq: Four times a day (QID) | ORAL | Status: DC | PRN
  Administered 2021-09-29: 12:00:00 100 mg via ORAL

## 2021-09-29 MED ORDER — ORAL CARE MOUTH RINSE: 15.0000 mL | Freq: Once | OROMUCOSAL | Status: AC

## 2021-09-29 MED ORDER — SODIUM CHLORIDE 0.9 % IR SOLN
Status: DC | PRN
  Administered 2021-09-29: 3000 mL

## 2021-09-29 MED ORDER — ACETAMINOPHEN 10 MG/ML IV SOLN
INTRAVENOUS | Status: AC
  Administered 2021-09-29: 18:00:00 1000 mg
  Filled 2021-09-29: qty 100

## 2021-09-29 MED ORDER — SODIUM CHLORIDE 0.9 % IV SOLN: 250.0000 mL | INTRAVENOUS | Status: DC | PRN

## 2021-09-29 MED ORDER — CEFAZOLIN SODIUM-DEXTROSE 1-4 GM/50ML-% IV SOLN
1.0000 g | Freq: Three times a day (TID) | INTRAVENOUS | Status: AC
  Administered 2021-09-29 – 2021-09-30 (×2): 1 g via INTRAVENOUS
  Filled 2021-09-29 (×2): qty 50

## 2021-09-29 MED ORDER — HYDROMORPHONE HCL 1 MG/ML IJ SOLN
0.5000 mg | INTRAMUSCULAR | Status: DC | PRN
  Administered 2021-09-29: 15:00:00 1 mg via INTRAVENOUS

## 2021-09-29 MED ORDER — HYDROMORPHONE HCL 1 MG/ML IJ SOLN
INTRAMUSCULAR | Status: AC
  Filled 2021-09-29: qty 1

## 2021-09-29 MED ORDER — MIDAZOLAM HCL 2 MG/2ML IJ SOLN
INTRAMUSCULAR | Status: AC
  Filled 2021-09-29: qty 2

## 2021-09-29 MED ORDER — CHLORHEXIDINE GLUCONATE 0.12 % MT SOLN
15.0000 mL | Freq: Once | OROMUCOSAL | Status: AC
  Administered 2021-09-29: 06:00:00 15 mL via OROMUCOSAL

## 2021-09-29 MED ORDER — ONDANSETRON HCL 4 MG/2ML IJ SOLN
4.0000 mg | INTRAMUSCULAR | Status: DC | PRN
  Administered 2021-09-29: 16:00:00 4 mg via INTRAVENOUS

## 2021-09-29 MED ORDER — SODIUM CHLORIDE 0.9% FLUSH: 3.0000 mL | INTRAVENOUS | Status: DC | PRN

## 2021-09-29 MED ORDER — KETOROLAC TROMETHAMINE 30 MG/ML IJ SOLN: 30.0000 mg | Freq: Once | INTRAMUSCULAR | Status: DC | PRN

## 2021-09-29 MED ORDER — LIDOCAINE 2% (20 MG/ML) 5 ML SYRINGE
INTRAMUSCULAR | Status: DC | PRN
  Administered 2021-09-29: 100 mg via INTRAVENOUS

## 2021-09-29 MED ORDER — HYDROMORPHONE HCL 1 MG/ML IJ SOLN
0.2500 mg | INTRAMUSCULAR | Status: DC | PRN
  Administered 2021-09-29 (×4): 0.5 mg via INTRAVENOUS

## 2021-09-29 MED ORDER — CHLORHEXIDINE GLUCONATE CLOTH 2 % EX PADS: 6.0000 | MEDICATED_PAD | Freq: Every day | CUTANEOUS | Status: DC

## 2021-09-29 MED ORDER — PHENYLEPHRINE 40 MCG/ML (10ML) SYRINGE FOR IV PUSH (FOR BLOOD PRESSURE SUPPORT)
PREFILLED_SYRINGE | INTRAVENOUS | Status: DC | PRN
  Administered 2021-09-29 (×2): 40 ug via INTRAVENOUS

## 2021-09-29 MED ORDER — SCOPOLAMINE 1 MG/3DAYS TD PT72
MEDICATED_PATCH | TRANSDERMAL | Status: DC | PRN
  Administered 2021-09-29: 1 via TRANSDERMAL

## 2021-09-29 MED ORDER — SCOPOLAMINE 1 MG/3DAYS TD PT72
MEDICATED_PATCH | TRANSDERMAL | Status: AC
  Filled 2021-09-29: qty 1

## 2021-09-29 MED ORDER — BACITRACIN-NEOMYCIN-POLYMYXIN 400-5-5000 EX OINT: TOPICAL_OINTMENT | CUTANEOUS | Status: DC | PRN

## 2021-09-29 SURGICAL SUPPLY — 61 items
BAG COUNTER SPONGE SURGICOUNT (BAG) IMPLANT
BAG URINE DRAIN 2000ML AR STRL (UROLOGICAL SUPPLIES) IMPLANT
BASKET STONE NCOMPASS (UROLOGICAL SUPPLIES) IMPLANT
BASKET ZERO TIP NITINOL 2.4FR (BASKET) ×1 IMPLANT
BENZOIN TINCTURE PRP APPL 2/3 (GAUZE/BANDAGES/DRESSINGS) ×2 IMPLANT
BLADE SURG 15 STRL LF DISP TIS (BLADE) ×1 IMPLANT
BLADE SURG 15 STRL SS (BLADE) ×2
BOOTIES KNEE HIGH SLOAN (MISCELLANEOUS) IMPLANT
CATH 2WAY 30CC 24FR (CATHETERS) IMPLANT
CATH URETERAL DUAL LUMEN 10F (MISCELLANEOUS) ×2 IMPLANT
CATH URETL OPEN 5X70 (CATHETERS) ×2 IMPLANT
CATH UROLOGY TORQUE 65 (CATHETERS) IMPLANT
CATH X-FORCE N30 NEPHROSTOMY (TUBING) ×2 IMPLANT
CHLORAPREP W/TINT 26 (MISCELLANEOUS) ×2 IMPLANT
DRAPE C-ARM 42X120 X-RAY (DRAPES) ×2 IMPLANT
DRAPE LINGEMAN PERC (DRAPES) ×2 IMPLANT
DRAPE SURG IRRIG POUCH 19X23 (DRAPES) ×4 IMPLANT
DRSG PAD ABDOMINAL 8X10 ST (GAUZE/BANDAGES/DRESSINGS) ×2 IMPLANT
DRSG TEGADERM 4X4.75 (GAUZE/BANDAGES/DRESSINGS) ×1 IMPLANT
DRSG TEGADERM 8X12 (GAUZE/BANDAGES/DRESSINGS) ×4 IMPLANT
EXTRACTOR STONE 1.7FRX115CM (UROLOGICAL SUPPLIES) IMPLANT
GAUZE SPONGE 4X4 12PLY STRL (GAUZE/BANDAGES/DRESSINGS) ×1 IMPLANT
GLOVE SURG ENC TEXT LTX SZ7.5 (GLOVE) ×2 IMPLANT
GOWN STRL REUS W/TWL XL LVL3 (GOWN DISPOSABLE) ×2 IMPLANT
GUIDEWIRE AMPLAZ .035X145 (WIRE) ×4 IMPLANT
GUIDEWIRE ANG ZIPWIRE 038X150 (WIRE) IMPLANT
GUIDEWIRE STR DUAL SENSOR (WIRE) ×3 IMPLANT
HLDR NDL AMPLATZ W/INSERTS (MISCELLANEOUS) IMPLANT
HOLDER NEEDLE AMPLATZ W/INSERT (MISCELLANEOUS) IMPLANT
IV SET EXTENSION CATH 6 NF (IV SETS) ×2 IMPLANT
KIT BASIN OR (CUSTOM PROCEDURE TRAY) ×2 IMPLANT
KIT PROBE 340X3.4XDISP GRN (MISCELLANEOUS) IMPLANT
KIT PROBE TRILOGY 3.4X340 (MISCELLANEOUS)
KIT PROBE TRILOGY 3.9X350 (MISCELLANEOUS) ×1 IMPLANT
LASER FIB FLEXIVA PULSE ID 365 (Laser) IMPLANT
MANIFOLD NEPTUNE II (INSTRUMENTS) ×2 IMPLANT
NDL SPNL 20GX3.5 QUINCKE YW (NEEDLE) IMPLANT
NDL TROCAR 18X15 ECHO (NEEDLE) IMPLANT
NDL TROCAR 18X20 (NEEDLE) IMPLANT
NEEDLE HYPO 22GX1.5 SAFETY (NEEDLE) ×1 IMPLANT
NEEDLE SPNL 20GX3.5 QUINCKE YW (NEEDLE) IMPLANT
NEEDLE TROCAR 18X15 ECHO (NEEDLE) ×2 IMPLANT
NEEDLE TROCAR 18X20 (NEEDLE) IMPLANT
NS IRRIG 1000ML POUR BTL (IV SOLUTION) ×2 IMPLANT
PACK CYSTO (CUSTOM PROCEDURE TRAY) ×2 IMPLANT
SHEATH PEELAWAY SET 9 (SHEATH) IMPLANT
SPONGE T-LAP 4X18 ~~LOC~~+RFID (SPONGE) ×2 IMPLANT
STENT URET 6FRX26 CONTOUR (STENTS) ×1 IMPLANT
SUT ETHILON 3 0 PS 1 (SUTURE) IMPLANT
SUT SILK 0 (SUTURE) ×2
SUT SILK 0 30XBRD TIE 6 (SUTURE) ×1 IMPLANT
SYR 10ML LL (SYRINGE) ×2 IMPLANT
SYR 20ML LL LF (SYRINGE) ×4 IMPLANT
SYR 50ML LL SCALE MARK (SYRINGE) ×2 IMPLANT
TOWEL OR 17X26 10 PK STRL BLUE (TOWEL DISPOSABLE) ×2 IMPLANT
TRACTIP FLEXIVA PULS ID 200XHI (Laser) IMPLANT
TRACTIP FLEXIVA PULSE ID 200 (Laser)
TRAY FOLEY MTR SLVR 16FR STAT (SET/KITS/TRAYS/PACK) ×2 IMPLANT
TUBING CONNECTING 10 (TUBING) ×4 IMPLANT
TUBING STONE CATCHER TRILOGY (MISCELLANEOUS) ×1 IMPLANT
TUBING UROLOGY SET (TUBING) ×2 IMPLANT

## 2021-09-29 NOTE — Anesthesia Postprocedure Evaluation (Signed)
Anesthesia Post Note  Patient: Virginia [name]  Procedure(s) Performed: NEPHROLITHOTOMY RIGHT PERCUTANEOUS WITH SURGEON ACCESS LEFT URETERAL STENT REMOVAL AND RIGHT STENT EXCHANGE (Right)     Patient location during evaluation: PACU Anesthesia Type: General Level of consciousness: awake and alert Pain management: pain level controlled Vital Signs Assessment: post-procedure vital signs reviewed and stable Respiratory status: spontaneous breathing, nonlabored ventilation, respiratory function stable and patient connected to nasal cannula oxygen Cardiovascular status: blood pressure returned to baseline and stable Postop Assessment: no apparent nausea or vomiting Anesthetic complications: no   No notable events documented.  Last Vitals:  Vitals:   09/29/21 1115 09/29/21 1130  BP: 110/71 108/70  Pulse: 84 85  Resp: 12 11  Temp:    SpO2: 99% 99%    Last Pain:  Vitals:   09/29/21 1115  TempSrc:   PainSc: Asleep                 Dahmir Epperly S

## 2021-09-29 NOTE — H&P (Signed)
Patient is a 56 year old white female who presented to the emergency room on 08/30/2021 with bilateral flank pain. She was found to have a 2.5 cm right renal pelvis stone with malrotated right kidney and 2 3 to 4 mm distal left ureteral calculi. Patient subsequent underwent emergent cystoscopy left ureteroscopy with laser lithotripsy and stone extraction on the left and bilateral JJ stent insertion on 08/30/2021. She is now here for follow-up. In the interim she is having some stent discomfort but overall doing fairly well.     ALLERGIES: No Allergies    MEDICATIONS: Ambien 10 mg tablet Oral  Sertraline Hcl  Zoloft 50 mg tablet Oral     GU PSH: Hysterectomy Unilat SO - 2016       PSH Notes: Total Abdominal Hysterectomy, Cesarean Section, Leg Repair,collar bone   NON-GU PSH: Cesarean Delivery Only - 2016     GU PMH: None   NON-GU PMH: Personal history of (healed) traumatic fracture, History of fracture of clavicle - 2016 Diabetes Type 2 Encounter for general adult medical examination without abnormal findings, Encounter for preventive health examination    FAMILY HISTORY: None   SOCIAL HISTORY: Marital Status: Divorced Preferred Language: English; Ethnicity: Not Hispanic Or Latino; Race: White Current Smoking Status: Patient has never smoked.   Tobacco Use Assessment Completed: Used Tobacco in last 30 days? Does not use smokeless tobacco. Does not use drugs. Drinks 2 caffeinated drinks per day. Has not had a blood transfusion.    REVIEW OF SYSTEMS:    GU Review Female:   Patient denies frequent urination, hard to postpone urination, burning /pain with urination, get up at night to urinate, leakage of urine, stream starts and stops, trouble starting your stream, have to strain to urinate, and being pregnant.  Gastrointestinal (Upper):   Patient reports nausea and vomiting. Patient denies indigestion/ heartburn.  Gastrointestinal (Lower):   Patient reports diarrhea. Patient  denies constipation.  Constitutional:   Patient reports night sweats and fatigue. Patient denies fever and weight loss.  Skin:   Patient denies skin rash/ lesion and itching.  Eyes:   Patient denies blurred vision and double vision.  Ears/ Nose/ Throat:   Patient denies sore throat and sinus problems.  Hematologic/Lymphatic:   Patient denies swollen glands and easy bruising.  Cardiovascular:   Patient denies leg swelling and chest pains.  Respiratory:   Patient reports cough. Patient denies shortness of breath.  Endocrine:   Patient denies excessive thirst.  Musculoskeletal:   Patient reports back pain. Patient denies joint pain.  Neurological:   Patient denies headaches and dizziness.  Psychologic:   Patient denies depression and anxiety.   VITAL SIGNS: None   MULTI-SYSTEM PHYSICAL EXAMINATION:    Constitutional: Well-nourished. No physical deformities. Normally developed. Good grooming.  Neck: Neck symmetrical, not swollen. Normal tracheal position.  Respiratory: No labored breathing, no use of accessory muscles.   Cardiovascular: Normal temperature, normal extremity pulses, no swelling, no varicosities.  Lymphatic: No enlargement of neck, axillae, groin.  Skin: No paleness, no jaundice, no cyanosis. No lesion, no ulcer, no rash.  Neurologic / Psychiatric: Oriented to time, oriented to place, oriented to person. No depression, no anxiety, no agitation.  Eyes: Normal conjunctivae. Normal eyelids.  Ears, Nose, Mouth, and Throat: Left ear no scars, no lesions, no masses. Right ear no scars, no lesions, no masses. Nose no scars, no lesions, no masses. Normal hearing. Normal lips.  Musculoskeletal: Normal gait and station of head and neck.     Complexity  of Data:  Source Of History:  Patient  Records Review:   Previous Doctor Records, Previous Hospital Records  Urine Test Review:   Urinalysis   PROCEDURES:          Urinalysis w/Scope - 81001 Dipstick Dipstick Cont'd Micro  Specimen:  Voided Bilirubin: Neg WBC/hpf: 6 - 10/hpf  Color: Red Ketones: Trace RBC/hpf: 40-60/hpf  Appearance: Turbid Blood: 3+ Bacteria: NS (Not Seen)  Specific Gravity: 1.020 Protein: 3+ Cystals: NS (Not Seen)  pH: 5.5 Urobilinogen: 0.2 Casts: NS (Not Seen)  Glucose: Neg Nitrites: Positive Trichomonas: Not Present    Leukocyte Esterase: 3+ Mucous: Present      Epithelial Cells: NS (Not Seen)      Yeast: NS (Not Seen)      Sperm: Not Present    ASSESSMENT:      ICD-10 Details  1 GU:   Renal calculus - Y69.4 Acute, Complicated Injury  2   Ureteral calculus - W54.6 Acute, Complicated Injury   PLAN:            Medications New Meds: Tamsulosin Hcl 0.4 mg capsule 1 capsule PO Daily   #30  1 Refill(s)  Pharmacy Name:  [facility] [id number]  Address:  [place], [place] [address]  Phone:  [phone]  Fax:  [phone]            Document Letter(s):  Created for Patient: Clinical Summary         Notes:   Patient will be scheduled to see Dr. Louis Meckel regarding PCNL of right renal calculus. I discussed possibility of taking out the left JJ stent but there was some risk of possible stent entanglement which could dislodge the right stent. After discussion with the patient she wants to wait and see how long it would be to get definitive management of her right renal calculus. In the interim we will try tamsulosin 0.4 mg daily for stent discomfort.

## 2021-09-29 NOTE — Op Note (Signed)
Pre-operative diagnosis: right sided nephrolithiasis, >2.0 centimeters  Post-operative diagnosis: as above    Procedure performed:  cystoscopy, right retrograde pyelogram with interpretation, right diagnostic ureteroscopy, right percutaneous renal access, right nephrolithotomy, right nephrostogram,  right ureteral stent exchange, left ureteral stent removal    Surgeon: Dr. Ardis Hughs  Assistant: Dr. Darl Pikes   Anesthesia: General   Complications: None   Specimens: Several small fragments of the stone extracted will be sent to [facility] for stone composition analysis.   Findings: #1: The patient's stone was then the lower pole, the ureter had a high insertion, and the access was difficult given the sharp angle that the wire needed to be threaded through at the UPJ.  As such, I performed ureteroscopy and used the scope to direct the access point and then subsequently to help advance the wire around the UPJ and into the bladder. 2.:  The retrograde pyelogram performed at the start of the case after the stent was removed on her right demonstrated normal caliber ureter with no filling defects.  There was a large lower pole with that part of the renal pelvis being very dilated.  There is a long calyx up to the upper pole.  A filling defect in the lower pole is consistent with patient's known stone.  There were no additional filling defects or abnormalities.    EBL: Approximately 100 cc   Specimens: stone from collecting system - taken to [facility] lab   Indication: Indication: Virginia [name] is a 56 y.o.  patient with  a large burden right nephrolithiasis.  The patient had left ureteroscopy for an obstructing stone on the left and stent placement.  The stent was also placed on the right because of her large bone in the dilation of the lower pole.  The patient presented today for follow-up definitive management. After  reviewing the management options for  treatment, elected to proceed with the above surgical procedure(s). We have discussed the potential benefits and risks of the procedure, side effects of the proposed treatment, the likelihood of the patient achieving the goals of the procedure, and any potential problems that might occur during the procedure or recuperation. Informed consent has been obtained.     Description:  Consent was obtained in the preoperative holding area. The patient was marked appropriately and then taken back to the operating room where they were intubated on the gurney. The patient was flipped prone onto the OR table. Large jelly rolls were placed in the anterior axillary line on both sides allowing the patient's chest and abdomen to fall inbetween. The patient was then prepped and draped in the routine sterile fashion in the right flank. A timeout was then held confirming the proper side and procedure as well as antibiotics were administered.  I then used the flexible cystoscope and passed gently into the patient's urethra under visual guidance.  Once into the bladder I grasped the stent emanating from the patient's left ureter and pulled it under fluoroscopic guidance.  I then reintroduced the cystoscope and grasped the stent emanating from the patient's right ureteral orifice and pull it to the urethral meatus. I then passed a wire through the stent and up into the right collecting system. I then removed the stent over the wire and exchanged for a 5 [place] open-ended ureteral Pollock catheter. The Pollack catheter was then advanced up to the UPJ. The wire was then removed and a retrograde pyelogram was performed with the above findings. I  then turned my attention to the patient's right flank and obtaining percutaneous renal access.  Using the C-arm rotated at 25 and the bulls-eye technique with an 18-gauge coaxial needle the lower posterior lateral calyx was targeted. Then rotating the C-arm AP depth of our needle was noted  to be within the calyx and the inner part of the coaxial needle was removed.  I was unable to get the wire across the UPJ because of the sharp angle and as such I opted to proceed with ureteroscopic guided access.  Subsequently passed a flexible ureteroscope through the urethra and up into the right renal pelvis.  Then using a bull's-eye technique we targeted the tip of the scope.  We were able to get the access into the lower pole posterior lateral calyx.  The inner portion of the needle sheath was removed and a wire advanced into the kidney.  Using the flexible ureteroscope I was able to grasp and facilitate the wire down the ureter and into the bladder . An angiographic catheter was then advanced into the bladder and the wire removed. A Super Stiff wire was then passed into the angiographic catheter and the angiographic catheter removed.  Catheter over the wire and advanced a second wire into the ureter to the bladder.  The angiographic catheter again was passed over the guidewire and advanced into the bladder, the wire was then removed. A Super Stiff wire was then passed through angiographic catheter and angiographic catheter removed. The outer part of the sheath was then removed, establishing 2 superstiff wires through the targeted calyx and into the bladder.   The 35 French NephroMax balloon was then passed over one of the Super Stiff wires and the tip guided down into the targeted calyx.  This is under direct visualization ensuring that the untoward damage to the collecting system.  At this point the ureteroscope was subsequently removed and changed for a wire which was left in the ureter in the event that we would need it at some point through the surgery.  The balloon was then inflated to approximately 12 atm, and once there was no waist noted under fluoroscopy the access sheath was advanced over the balloon. The balloon was then removed. The wires were then placed back into the sheaths and snapped to the  drape.   Using the rigid nephroscope to explore the targeted calyx and kidney.  The stone easily located and broken up using the lithoclast device.  Then using a flexible cystoscope to navigate the remaining calyces of the kidney multiple smaller stone fragments encountered.  Using the 0 tip basket these fragments were grabbed and removed. Contrast was injected through the cystoscope and the calyces systematically inspected under fluoroscopic guidance to ensure that all stone fragments had been removed.   Then using the flexible ureteroscope the ureter was navigated in antegrade fashion. All stone fragments were pushed from the ureter into the bladder. Once the ureter was clear the scope was advanced into the bladder and a 0.038 sensor wire was left in the bladder and the scope backed out over wire. The sensor wire was then backloaded over the rigid nephroscope using the stent pusher and a 22 cm x 6 French double-J ureteral stent was passed antegrade over the sensor wire down into the bladder under fluoroscopic guidance. Once the stent was in the bladder the wire was gently pulled back and a nice curl noted in the bladder. The wire completely removed from the stent, and nice curl on the  proximal end of the stent was noted in the renal pelvis. The sheath was then backed out slowly to ensure that all calyces had been inspected and there was nothing behind the sheath.   A 28F ainsworth tip catheter was then passed over one of the Super Stiff wires through the sheath and into the renal pelvis. The sheath was then backed out of the kidney and cut over the red rubber catheter. A nephrostogram was then performed confirming the position of our nephrostomy tube and reassuring that there were no longer any filling defects from the patient's symptoms.  After several minutes of direct pressure and observation was noted that there was no significant bleeding from the nephrostomy tube or around the nephrostomy tube tract.   As such, I remove the nephrostomy tube as well as the safety wire. 25 cc of local anesthesia was then injected into the patient's wound, and the wound was closed with 3-0 nylon in 2 vertical mattress sutures. The incision was then padded using a bundle of 4 x 4's and Hypafix tape. Patient was subsequently rolled over to the supine position and extubated. The patient was returned to the PACU in excellent condition. At the end of the case all lap and needle and sponges were accounted for. There are no perioperative complications.

## 2021-09-29 NOTE — Discharge Instructions (Signed)
Discharge instructions following PCNL  Call your doctor for: Fevers greater than 100.5 Severe nausea or vomiting Increasing pain not controlled by pain medication Increasing redness or drainage from incisions Decreased urine output or a catheter is no longer draining  The number for questions is [phone].  Activity: Gradually increase activity with short frequent walks, 3-4 times a day.  Avoid strenuous activities, like sports, lawn-mowing, or heavy lifting (more than 10-15 pounds).  Wear loose, comfortable clothing that pull or kink the tube or tubes.  Do not drive while taking pain medication, or until your doctor permitts it.  Bathing and dressing changes: You should not shower for 48 hours after surgery.  Do not soak your back in a bathtub.  Diet: It is extremely important to drink plenty of fluids after surgery, especially water.  You may resume your regular diet, unless otherwise instructed.  Medications: May take Tylenol (acetaminophen) or ibuprofen (Advil, Motrin) as directed over-the-counter. Take any prescriptions as directed.  Follow-up appointments: Follow-up appointment will be scheduled with Dr. Avanthika Dehnert in 10-14 days for hospital check and stent removal.  

## 2021-09-29 NOTE — Transfer of Care (Signed)
Immediate Anesthesia Transfer of Care Note  Patient: Virginia [name]  Procedure(s) Performed: NEPHROLITHOTOMY RIGHT PERCUTANEOUS WITH SURGEON ACCESS LEFT URETERAL STENT REMOVAL AND RIGHT STENT EXCHANGE (Right)  Patient Location: PACU  Anesthesia Type:General  Level of Consciousness: sedated  Airway & Oxygen Therapy: Patient Spontanous Breathing and Patient connected to face mask oxygen  Post-op Assessment: Report given to RN and Post -op Vital signs reviewed and stable  Post vital signs: Reviewed and stable  Last Vitals:  Vitals Value Taken Time  BP 124/76 09/29/21 1030  Temp 37.1 C 09/29/21 1030  Pulse 105 09/29/21 1033  Resp 18 09/29/21 1033  SpO2 99 % 09/29/21 1033  Vitals shown include unvalidated device data.  Last Pain:  Vitals:   09/29/21 0613  TempSrc:   PainSc: 4       Patients Stated Pain Goal: 4 (19/37/90 2409)  Complications: No notable events documented.

## 2021-09-29 NOTE — Anesthesia Procedure Notes (Signed)
Procedure Name: Intubation Date/Time: 09/29/2021 7:38 AM Performed by: Talbot Grumbling, CRNA Pre-anesthesia Checklist: Patient identified, Emergency Drugs available, Suction available and Patient being monitored Patient Re-evaluated:Patient Re-evaluated prior to induction Oxygen Delivery Method: Circle system utilized Preoxygenation: Pre-oxygenation with 100% oxygen Induction Type: IV induction Ventilation: Mask ventilation without difficulty Laryngoscope Size: Mac and 3 Grade View: Grade I Tube type: Oral Tube size: 7.0 mm Number of attempts: 1 Airway Equipment and Method: Stylet Placement Confirmation: ETT inserted through vocal cords under direct vision, positive ETCO2 and breath sounds checked- equal and bilateral Secured at: 22 cm Tube secured with: Tape Dental Injury: Teeth and Oropharynx as per pre-operative assessment

## 2021-09-29 NOTE — Interval H&P Note (Signed)
History and Physical Interval Note:  09/29/2021 7:06 AM  Virginia [name]  has presented today for surgery, with the diagnosis of RIGHT URETERAL PELVIC JUNCTION STONE.  The various methods of treatment have been discussed with the patient and family. After consideration of risks, benefits and other options for treatment, the patient has consented to  Procedure(s) with comments: NEPHROLITHOTOMY RIGHT PERCUTANEOUS WITH SURGEON ACCESS LEFT URETERAL [place] (Right) - 3.5 HRS as a surgical intervention.  The patient's history has been reviewed, patient examined, no change in status, stable for surgery.  I have reviewed the patient's chart and labs.  Questions were answered to the patient's satisfaction.     Ardis Hughs

## 2021-09-30 ENCOUNTER — Encounter (HOSPITAL_COMMUNITY): Payer: Self-pay | Admitting: Urology

## 2021-09-30 LAB — BASIC METABOLIC PANEL
Anion gap: 3 — ABNORMAL LOW (ref 5–15)
BUN: 12 mg/dL (ref 6–20)
CO2: 26 mmol/L (ref 22–32)
Calcium: 8.3 mg/dL — ABNORMAL LOW (ref 8.9–10.3)
Chloride: 109 mmol/L (ref 98–111)
Creatinine, Ser: 0.73 mg/dL (ref 0.44–1.00)
GFR, Estimated: >60 mL/min (ref >60)
Glucose, Bld: 129 mg/dL — ABNORMAL HIGH (ref 70–99)
Potassium: 3.6 mmol/L (ref 3.5–5.1)
Sodium: 138 mmol/L (ref 135–145)

## 2021-09-30 LAB — CBC
HCT: 30.8 % — ABNORMAL LOW (ref 36.0–46.0)
Hemoglobin: 10 g/dL — ABNORMAL LOW (ref 12.0–15.0)
MCH: 31.5 pg (ref 26.0–34.0)
MCHC: 32.5 g/dL (ref 30.0–36.0)
MCV: 97.2 fL (ref 80.0–100.0)
Platelets: 187 10*3/uL (ref 150–400)
RBC: 3.17 MIL/uL — ABNORMAL LOW (ref 3.87–5.11)
RDW: 12.5 % (ref 11.5–15.5)
WBC: 8.9 10*3/uL (ref 4.0–10.5)
nRBC: 0 % (ref 0.0–0.2)

## 2021-09-30 NOTE — Progress Notes (Addendum)
Urology Progress Note   1 Day Post-Op from right-sided PCNL..   Subjective: NAEON.  Blood pressures moderately soft but patient is asymptomatic.  Other vital signs are stable.  Adequate urine output.  Voiding spontaneously now.  Hemoglobin stable at 10 from 10.8 postoperatively.  Creatinine stable at around 0.7.  Objective: Vital signs in last 24 hours: Temp:  [97.7 F (36.5 C)-98.7 F (37.1 C)] 98 F (36.7 C) (01/26 0524) Pulse Rate:  [66-105] 76 (01/26 0524) Resp:  [8-20] 18 (01/26 0524) BP: (85-124)/(49-77) 100/65 (01/26 0524) SpO2:  [89 %-100 %] 95 % (01/26 0524)  Intake/Output from previous day: 01/25 0701 - 01/26 0700 In: 4047.4 [P.O.:730; I.V.:3067.4; IV Piggyback:250] Out: 2780 [Urine:2630; Blood:150] Intake/Output this shift: No intake/output data recorded.  Physical Exam:  General: Alert and oriented CV: Regular rate Lungs: No increased work of breathing Abdomen: Soft, appropriately tender. Incision on right flank c/d/i.  GU: Voiding spontaneously Ext: NT, No erythema  Lab Results: Recent Labs    09/27/21 0857 09/29/21 1051 09/30/21 0430  HGB 12.5 10.8* 10.0*  HCT 37.4 33.2* 30.8*   Recent Labs    09/29/21 1051 09/30/21 0430  NA 137 138  K 3.2* 3.6  CL 108 109  CO2 23 26  GLUCOSE 196* 129*  BUN 17 12  CREATININE 0.76 0.73  CALCIUM 8.2* 8.3*    Studies/Results: DG C-Arm 1-60 Min-No Report  Result Date: 09/29/2021 Fluoroscopy was utilized by the requesting physician.  No radiographic interpretation.   DG C-Arm 1-60 Min-No Report  Result Date: 09/29/2021 Fluoroscopy was utilized by the requesting physician.  No radiographic interpretation.   DG C-Arm 1-60 Min-No Report  Result Date: 09/29/2021 Fluoroscopy was utilized by the requesting physician.  No radiographic interpretation.    Assessment/Plan:  56 y.o. female s/p right PCNL on 09/30/2021 with Dr. Louis Meckel overall doing well post-op.   -Normalized today with regular diet, stop IV  fluids.  Discontinue IV pain medication. -Continue as needed tramadol  Dispo: Likely home today with scheduled ureteral stent pull on Tuesday 23   LOS: 0 days

## 2021-09-30 NOTE — Progress Notes (Signed)
Foley D/C this morning at 0500. Pt tolerated well, voided a small, unmeasured amount after it was removed and then ambulated roughly 260 ft in hallway.

## 2021-09-30 NOTE — Discharge Summary (Signed)
[facility] Discharge Summary  Admit date: 09/29/2021  Discharge date and time: 09/30/21   Discharge to: Home  Discharge Service: Urology  Discharge Attending Physician: Louis Meckel, MD  Discharge  Diagnoses: Nephrolithiasis  Secondary Diagnosis: Principal Problem:   Nephrolithiasis   OR Procedures: Procedure(s): NEPHROLITHOTOMY RIGHT PERCUTANEOUS WITH SURGEON ACCESS LEFT URETERAL STENT REMOVAL AND RIGHT STENT EXCHANGE 09/29/2021   Ancillary Procedures: None   Discharge Day Services: The patient was seen and examined by the Urology team both in the morning and immediately prior to discharge.  Vital signs and laboratory values were stable and within normal limits.  The physical exam was benign and unchanged and all surgical wounds were examined.  Discharge instructions were explained and all questions answered.  Subjective  No acute events overnight. Pain Controlled. No fever or chills.  Objective Patient Vitals for the past 8 hrs:  BP Temp Temp src Pulse Resp SpO2  09/30/21 0524 100/65 98 F (36.7 C) Oral 76 18 95 %  09/30/21 0437 (!) 92/54 -- -- 69 16 94 %  09/30/21 0109 (!) 88/52 -- -- -- -- 93 %  09/30/21 0059 (!) 85/49 97.7 F (36.5 C) Oral 68 18 (!) 89 %   No intake/output data recorded.  General Appearance:        No acute distress Lungs:                       Normal work of breathing on room air Heart:                                Regular rate and rhythm Abdomen:                         Soft, non-tender, non-distended.  Right flank incision is clean dry and intact. GU: Voiding spontaneously Extremities:                      Warm and well perfused   Hospital Course:  The patient underwent right PCNL on 09/29/2021.  The patient tolerated the procedure well, was extubated in the OR, and afterwards was taken to the PACU for routine post-surgical care. When stable the patient was transferred to the floor.   The patient did well postoperatively.  The  patients diet was slowly advanced and at the time of discharge was tolerating a regular diet.  The patient was discharged home 1 Day Post-Op, at which point was tolerating a regular solid diet, was able to void spontaneously, have adequate pain control with P.O. pain medication, and could ambulate without difficulty. The patient will follow up with [place] for post op check and ureteral stent pull on 10/13/2021.   Condition at Discharge: Improved  Discharge Medications:  Allergies as of 09/30/2021       Reactions   Inapsine [droperidol] Anaphylaxis   Percocet [oxycodone-acetaminophen] Other (See Comments)   Makes pt feel very bad.         Medication List     STOP taking these medications    HYDROcodone-acetaminophen 5-325 MG tablet Commonly known as: NORCO/VICODIN   rifaximin 550 MG Tabs tablet Commonly known as: XIFAXAN       TAKE these medications    diphenoxylate-atropine 2.5-0.025 MG tablet Commonly known as: Lomotil Take 1 to 2 tablets every 6 hours as needed for diarrhea   doxylamine (Sleep) 25 MG tablet  Commonly known as: UNISOM Take 25 mg by mouth at bedtime.   estradiol 1 MG tablet Commonly known as: ESTRACE Take 1 mg by mouth at bedtime.   sertraline 100 MG tablet Commonly known as: ZOLOFT Take 100 mg by mouth at bedtime. In bedtime.   traMADol 50 MG tablet Commonly known as: Ultram Take 1-2 tablets (50-100 mg total) by mouth every 6 (six) hours as needed for moderate pain.

## 2021-09-30 NOTE — Progress Notes (Signed)
°  Transition of Care [facility]) Screening Note   Patient Details  Name: Virginia [name] Date of Birth: May 28, 1966   [facility] CM/SW Contact:    Dessa Phi, RN Phone Number: 09/30/2021, 10:13 AM    Transition of Care Department [facility]) has reviewed patient and no TOC needs have been identified at this time. We will continue to monitor patient advancement through interdisciplinary progression rounds. If new patient transition needs arise, please place a TOC consult.

## 2021-10-05 LAB — CALCULI, WITH PHOTOGRAPH (CLINICAL LAB)
Calcium Oxalate Dihydrate: 90 %
Calcium Oxalate Monohydrate: 10 %
Weight Calculi: 12 mg

## 2023-08-05 IMAGING — CT CT RENAL STONE PROTOCOL
2 of 4 series · 15 of 46 positions shown, 17 images · non-contrast
Comparison: February 08, 2016

CLINICAL DATA: Left flank pain.

EXAM:
CT ABDOMEN AND PELVIS WITHOUT CONTRAST
TECHNIQUE: Multidetector CT imaging of the abdomen and pelvis was performed
following the standard protocol without IV contrast.

[Series 2: axial st · axial · 0.87mm/px · z∈[+544,+999]mm · 12 of 101 slices shown, 14 images]
[im 5/101  soft-tissue]
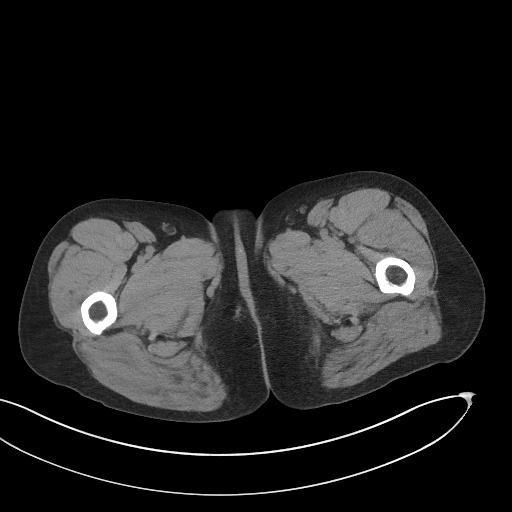
[im 5/101  bone]
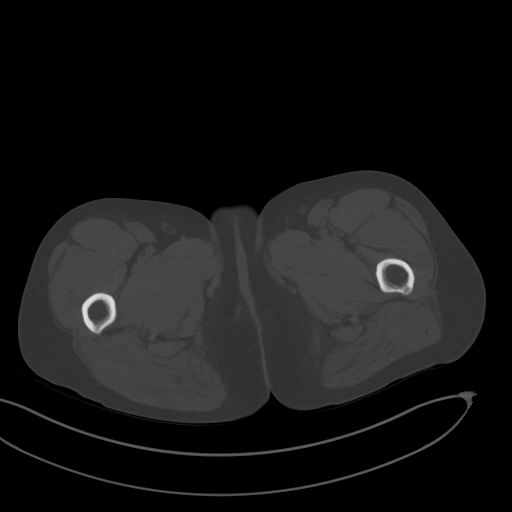
[im 14/101  soft-tissue]
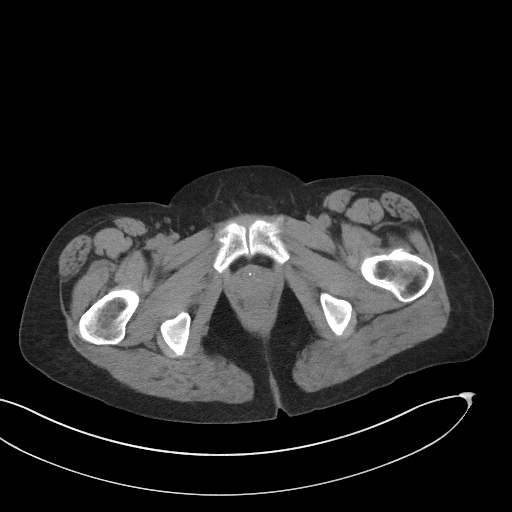
[im 22/101  soft-tissue]
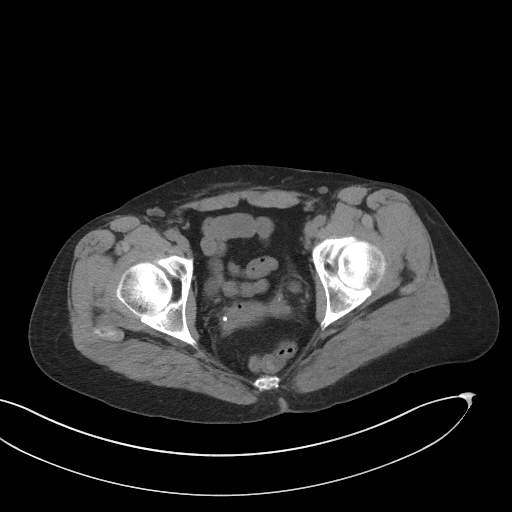
[im 31/101  soft-tissue]
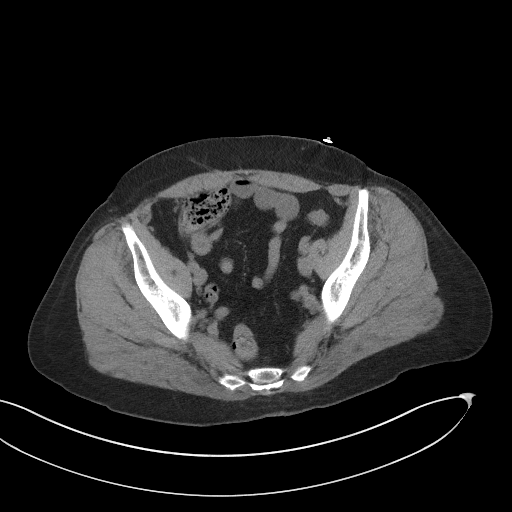
[im 40/101  soft-tissue]
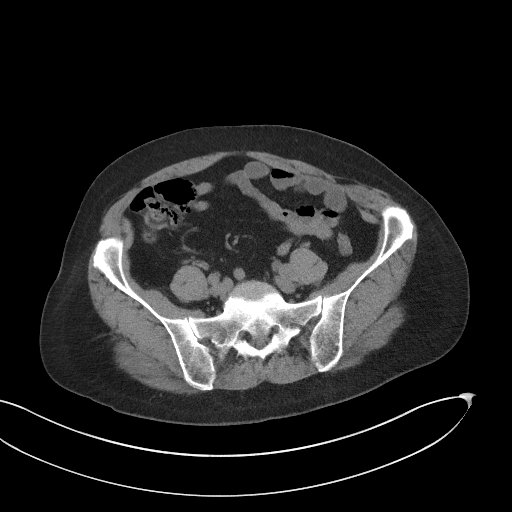
[im 48/101  soft-tissue]
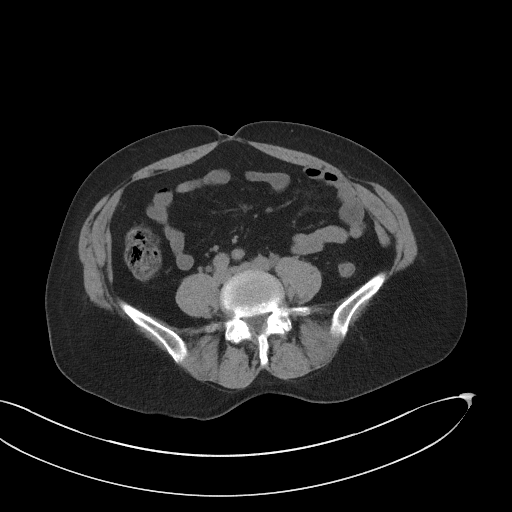
[im 53/101  soft-tissue]
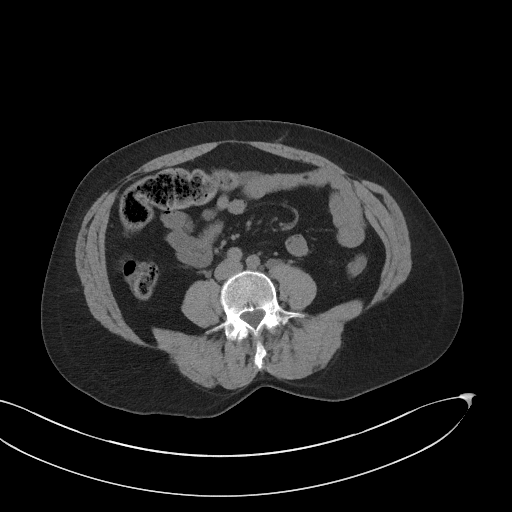
[im 61/101  soft-tissue]
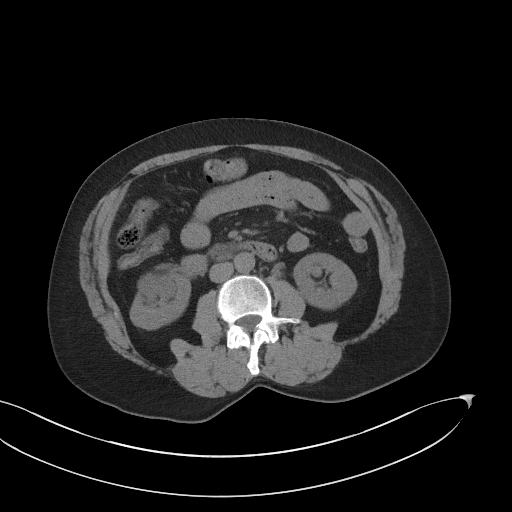
[im 70/101  soft-tissue]
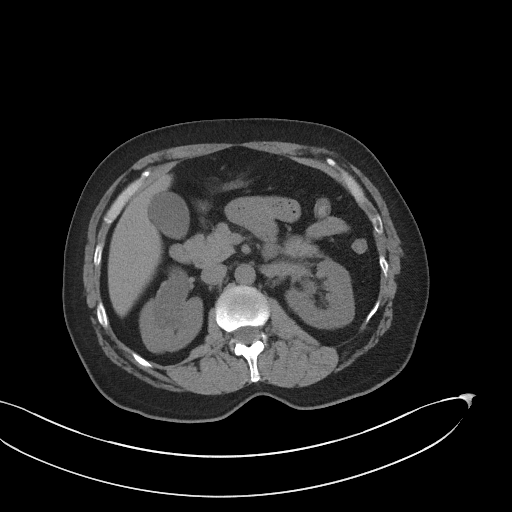
[im 70/101  bone]
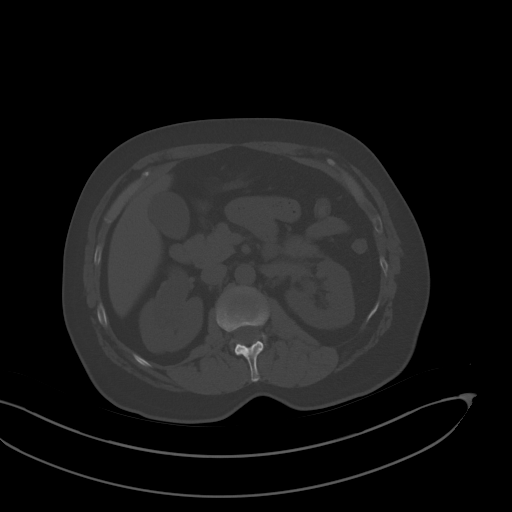
[im 79/101  soft-tissue]
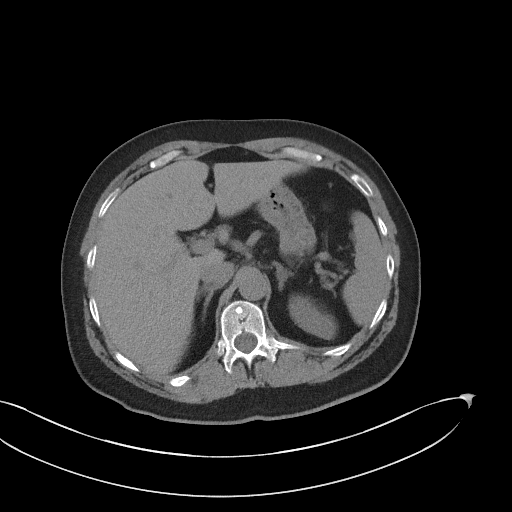
[im 87/101  soft-tissue]
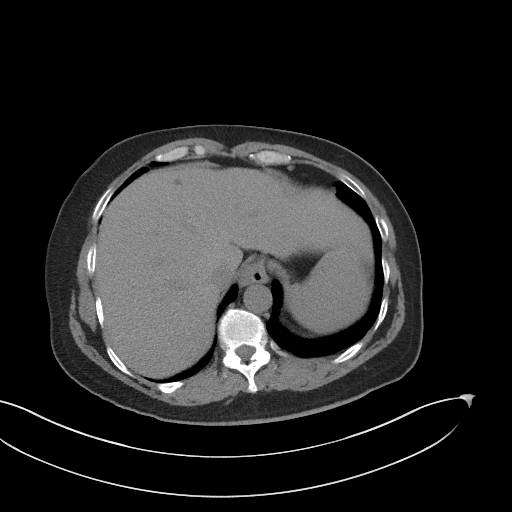
[im 96/101  soft-tissue]
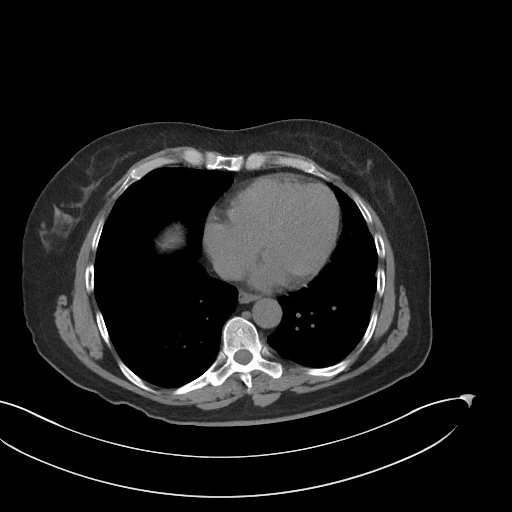

[Series 5: coronal st · coronal · 0.76mm/px · 3 of 94 slices shown]
[im 32/94  soft-tissue]
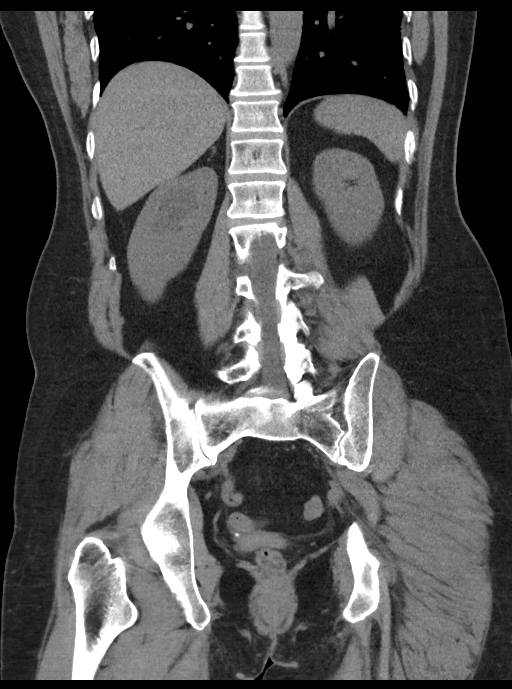
[im 42/94  soft-tissue]
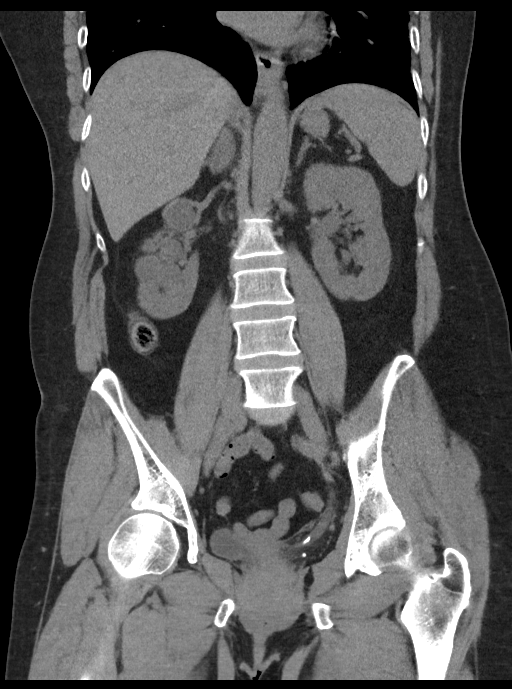
[im 52/94  soft-tissue]
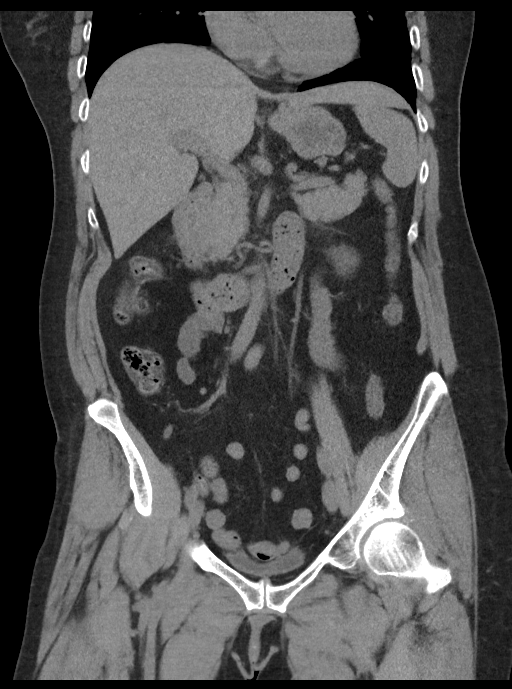

[15 of 46 positions shown; findings below may reference images not displayed]

FINDINGS: Lower chest: No acute abnormality.

Hepatobiliary: A 6 mm focus of parenchymal low attenuation is seen
within the anterior aspect of the right lobe of the liver. A similar
appearing 4 mm focus of parenchymal low attenuation is noted within
the posterior aspect of the right lobe. No gallstones, gallbladder
wall thickening, or biliary dilatation.

Pancreas: Unremarkable. No pancreatic ductal dilatation or
surrounding inflammatory changes.

Spleen: Normal in size without focal abnormality.

Adrenals/Urinary Tract: Adrenal glands are unremarkable. Kidneys are
normal in size, without focal lesions. Mild, stable congenital
malrotation of the right kidney is noted. A 2.1 cm obstructing renal
stone is seen within the proximal right ureter with marked severity
right-sided hydronephrosis and hydroureter. Adjacent 2 mm and 3 mm
obstructing renal stones are seen within the distal left ureter,
near the left UVJ (axial CT image 81, CT series 2). Moderate
severity left-sided hydronephrosis and hydroureter are present. The
urinary bladder is partially contracted and subsequently limited in
evaluation.

Stomach/Bowel: There is a small hiatal hernia. Appendix appears
normal. No evidence of bowel wall thickening, distention, or
inflammatory changes.

Vascular/Lymphatic: No significant vascular findings are present. No
enlarged abdominal or pelvic lymph nodes.

Reproductive: Status post hysterectomy. No adnexal masses.

Other: No abdominal wall hernia or abnormality. No abdominopelvic
ascites.

Musculoskeletal: No acute or significant osseous findings.
IMPRESSION: 1. 2.1 cm obstructing renal stone within the proximal right ureter
with marked severity right-sided hydronephrosis and hydroureter.
2. Adjacent 2 mm and 3 mm obstructing renal stones within the distal
left ureter, near the left UVJ.
3. Small hiatal hernia.
4. Small hepatic cysts versus hemangiomas. Correlation with
nonemergent hepatic ultrasound is recommended.

## 2023-08-09 DIAGNOSIS — Z1231 Encounter for screening mammogram for malignant neoplasm of breast: Secondary | ICD-10-CM | POA: Diagnosis not present

## 2023-08-09 DIAGNOSIS — F5101 Primary insomnia: Secondary | ICD-10-CM | POA: Diagnosis not present

## 2023-08-09 DIAGNOSIS — N951 Menopausal and female climacteric states: Secondary | ICD-10-CM | POA: Diagnosis not present

## 2023-08-09 DIAGNOSIS — Z01419 Encounter for gynecological examination (general) (routine) without abnormal findings: Secondary | ICD-10-CM | POA: Diagnosis not present

## 2023-08-22 DIAGNOSIS — M25561 Pain in right knee: Secondary | ICD-10-CM | POA: Diagnosis not present
